# Patient Record
Sex: Female | Born: 1995 | Race: White | Hispanic: No | Marital: Single | Smoking: Never smoker
Health system: Southern US, Community
[De-identification: ages and names within clinical notes are randomized; demographics above are authoritative.]

## PROBLEM LIST (undated history)

## (undated) DIAGNOSIS — E1065 Type 1 diabetes mellitus with hyperglycemia: Secondary | ICD-10-CM

## (undated) DIAGNOSIS — E119 Type 2 diabetes mellitus without complications: Secondary | ICD-10-CM

## (undated) DIAGNOSIS — L819 Disorder of pigmentation, unspecified: Secondary | ICD-10-CM

## (undated) HISTORY — DX: Type 2 diabetes mellitus without complications: E11.9

## (undated) HISTORY — DX: Type 1 diabetes mellitus with hyperglycemia: E10.65

## (undated) HISTORY — PX: WISDOM TOOTH EXTRACTION: SHX21

---
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
## 2017-01-12 ENCOUNTER — Encounter (INDEPENDENT_AMBULATORY_CARE_PROVIDER_SITE_OTHER): Payer: Self-pay | Admitting: Family

## 2017-01-31 ENCOUNTER — Other Ambulatory Visit (INDEPENDENT_AMBULATORY_CARE_PROVIDER_SITE_OTHER): Payer: Self-pay | Admitting: Family

## 2017-02-04 ENCOUNTER — Encounter (INDEPENDENT_AMBULATORY_CARE_PROVIDER_SITE_OTHER): Payer: Self-pay | Admitting: Family

## 2017-02-08 ENCOUNTER — Encounter (INDEPENDENT_AMBULATORY_CARE_PROVIDER_SITE_OTHER): Payer: Self-pay | Admitting: Family

## 2017-02-11 ENCOUNTER — Encounter (INDEPENDENT_AMBULATORY_CARE_PROVIDER_SITE_OTHER): Payer: Self-pay | Admitting: Family

## 2017-03-23 ENCOUNTER — Other Ambulatory Visit (INDEPENDENT_AMBULATORY_CARE_PROVIDER_SITE_OTHER): Payer: Self-pay | Admitting: *Deleted

## 2017-03-23 ENCOUNTER — Ambulatory Visit (INDEPENDENT_AMBULATORY_CARE_PROVIDER_SITE_OTHER): Payer: BLUE CROSS/BLUE SHIELD | Admitting: Family

## 2017-03-23 ENCOUNTER — Encounter (INDEPENDENT_AMBULATORY_CARE_PROVIDER_SITE_OTHER): Payer: Self-pay | Admitting: Family

## 2017-03-23 VITALS — BP 104/62 | HR 84 | Ht 66.81 in | Wt 131.6 lb

## 2017-03-23 DIAGNOSIS — Z23 Encounter for immunization: Secondary | ICD-10-CM | POA: Diagnosis not present

## 2017-03-23 DIAGNOSIS — F4320 Adjustment disorder, unspecified: Secondary | ICD-10-CM | POA: Diagnosis not present

## 2017-03-23 DIAGNOSIS — R739 Hyperglycemia, unspecified: Secondary | ICD-10-CM

## 2017-03-23 DIAGNOSIS — IMO0001 Reserved for inherently not codable concepts without codable children: Secondary | ICD-10-CM

## 2017-03-23 DIAGNOSIS — E1065 Type 1 diabetes mellitus with hyperglycemia: Principal | ICD-10-CM

## 2017-03-23 DIAGNOSIS — Z4681 Encounter for fitting and adjustment of insulin pump: Secondary | ICD-10-CM | POA: Diagnosis not present

## 2017-03-23 DIAGNOSIS — E109 Type 1 diabetes mellitus without complications: Secondary | ICD-10-CM | POA: Diagnosis not present

## 2017-03-23 LAB — POCT GLUCOSE (DEVICE FOR HOME USE): POC GLUCOSE: 221 mg/dL — AB (ref 70–99)

## 2017-03-23 LAB — POCT GLYCOSYLATED HEMOGLOBIN (HGB A1C): HEMOGLOBIN A1C: 7.3

## 2017-03-23 MED ORDER — GLUCOSE BLOOD VI STRP: ORAL_STRIP | 3 refills | Status: DC

## 2017-03-23 NOTE — Patient Instructions (Signed)
-   Pump settings changed - Continue CGm  - Rotate pump sites  - Keep glucose with you.  - Behave at school  - A1c is 7.3%.

## 2017-03-23 NOTE — Progress Notes (Signed)
Pediatric Endocrinology Diabetes Consultation Follow-up Visit  Katelyn Lutz March 06, 1996 [id number]  Chief Complaint: Follow-up type 1 diabetes   Ronnald Nian, MD   HPI: Katelyn Lutz  is a 21 y.o. female presenting for follow-up of type 1 diabetes. she is accompanied to this visit by her mother.  1. Katelyn Lutz was diagnosed with new onset type 1 diabetes on 10/12/12. She had completed a course of antibiotics for a facial laceration and had developed oral thrush. She was seen at Urgent Care where they questioned why a previously healthy young adult would have thrush. They obtained a finger stick BG which was too high to read on the POC BG meter. They then obtained serum labs and urine which were consistent with new onset diabetes. She was then sent to [facility] for admission. On presentation she was acidotic with a pH of 7.2. She was admitted for treatment and management of DKA and new-onset T1DM. She was started on a multiple daily injection (MDI) of Lantus and Novolog insulins. She was subsequently converted to a Medtronic 530G insulin pump and Enlite CGM sensor on 08/07/13. Later, however, she converted from the [facility] sensor to the Dexcom G5 sensor that communicates with her i-phone  2. Since last visit to [facility] on 12/2016, she has been well.  No ER visits or hospitalizations.   Katelyn Lutz is doing "good". She is home on break from [employer] for Thanksgiving. She is making all A's in her classes and is enjoying her college experience. Katelyn Lutz reports that her blood sugars are good overall but she is having lows around 6-8 am. She is wearing a Medtronic 530g insulin pump and uses a Dexcom CGM. She has educated her roommates on what to do if she has a low blood sugar and she feels comfortable with her diabetes at college. Overall, she feels things are going well.     Insulin regimen:   Basal:         12am: 0.90   4am: 1.10   8am: 1.10   12pm: 1.20   9pm: 1.30  Carb Ratio:   12am: 15   6am:  10   2pm: 12  Insulin Sensitivity    12am: 50.    10pm: 65  Hypoglycemia: Able to feel low blood sugars.  No glucagon needed recently.  Insulin Pump download: Avg BG: 173  Checking an avg of 6  times per day  Avg daily carb intake 258 grams.  Avg total daily insulin 50 units (51% basal, 49% bolus) CGM: Avg Bg 172  - Target Range: In range 49%, above rnage 50% and below range 1%.  Med-alert ID: Bracelet on wrist. Injection sites: Using abdomen and leg Annual labs due: 06/2017 Ophthalmology due: 06/2017    3. ROS: Greater than 10 systems reviewed with pertinent positives listed in HPI, otherwise neg. Review of Systems  Constitutional: Negative for malaise/fatigue.  HENT: Negative.   Eyes: Negative for blurred vision, photophobia and pain.       Wears glasses  Respiratory: Negative for cough and shortness of breath.   Cardiovascular: Negative for chest pain and palpitations.  Gastrointestinal: Negative for abdominal pain, constipation, diarrhea and nausea.  Genitourinary: Negative for frequency and urgency.  Musculoskeletal: Negative for neck pain.  Skin: Negative for itching and rash.  Neurological: Negative for dizziness, tingling, tremors, sensory change, seizures, weakness and headaches.  Endo/Heme/Allergies: Negative for polydipsia.  Psychiatric/Behavioral: Negative for depression. The patient is not nervous/anxious.      Past Medical History:   Past  Medical History:  Diagnosis Date  . Diabetes mellitus without complication (HCC)   . New onset type 1 diabetes mellitus, uncontrolled (HCC) 10/25/2012  . Pigmentation abnormality of skin    no pigment in eyelids or genital area    Medications:  Outpatient Encounter Medications as of 03/23/2017  Medication Sig  . ACCU-CHEK FASTCLIX LANCETS MISC Check sugar 10 x daily. Lancets come in boxes of 102 ea.. Please dispense 9 boxes.  Marland Kitchen. BAYER CONTOUR NEXT TEST test strip CHECK BLOOD SUGAR 10 X DAILY AS DIRECTED. PT IS ON INSULIN  PUMP.  Marland Kitchen. glucagon (GLUCAGON EMERGENCY) 1 MG injection USE FOR SEVERE HYPOGLYCEMIA, INJECT 1MG  INTRAMUSCULARLY IF UNRESPONSIVE OR UNCONCIOUS  . NOVOLOG 100 UNIT/ML injection USE 300 UNITS IN INSULIN PUMP EVERY 48-72 HOURS PER PROTOCOL  . Urine Glucose-Ketones Test STRP Use to check urine in cases of hyperglycemia. Dispense 2 vials of 50 each  . [DISCONTINUED] lidocaine-prilocaine (EMLA) cream Apply to skin as directed 30-60 minutes prior to inserting infusion set. Clean skin with alcohol prior to inserting. (Patient not taking: Reported on 09/28/2016)   No facility-administered encounter medications on file as of 03/23/2017.     Allergies: No Known Allergies  Surgical History: No past surgical history on file.  Family History:  Family History  Problem Relation Age of Onset  . Diabetes Other        father's maternal grandmother had type 1 diabetes  . Hypertension Maternal Aunt   . Asthma Maternal Uncle   . Asthma Paternal Aunt   . Hypertension Maternal Grandfather   . Thyroid disease Paternal Grandmother       Social History: Lives with: Mother and father   [employer]man year at [employer] Physical Exam:  Vitals:   03/23/17 1015  BP: 104/62  Pulse: 84  Weight: 131 lb 9.6 oz (59.7 kg)  Height: 5' 6.81" (1.697 m)   BP 104/62   Pulse 84   Ht 5' 6.81" (1.697 m)   Wt 131 lb 9.6 oz (59.7 kg)   BMI 20.73 kg/m  Body mass index: body mass index is 20.73 kg/m. Blood pressure percentiles are 16 % systolic and 26 % diastolic based on the August 2017 AAP Clinical Practice Guideline. Blood pressure percentile targets: 90: 127/78, 95: 129/82, 95 + 12 mmHg: 141/94.  Ht Readings from Last 3 Encounters:  03/23/17 5' 6.81" (1.697 m) (84 %, Z= 1.00)*  09/28/16 5' 6.54" (1.69 m) (82 %, Z= 0.90)*  06/28/16 5' 6.65" (1.693 m) (83 %, Z= 0.95)*   * Growth percentiles are based on CDC (Girls, 2-20 Years) data.   Wt Readings from Last 3 Encounters:  03/23/17 131 lb 9.6 oz (59.7 kg) (60 %, Z= 0.24)*   12/15/16 128 lb (58.1 kg) (55 %, Z= 0.11)*  09/28/16 129 lb 3.2 oz (58.6 kg) (58 %, Z= 0.20)*   * Growth percentiles are based on CDC (Girls, 2-20 Years) data.   Physical Exam   General: Well developed, well nourished female in no acute distress.  Appears  stated age. She is active and oriented.  Head: Normocephalic, atraumatic.   Eyes:  Pupils equal and round. EOMI.   Sclera white.  No eye drainage. Wearing glasses.  Ears/Nose/Mouth/Throat: Nares patent, no nasal drainage.  Normal dentition, mucous membranes moist.  Oropharynx intact. Neck: supple, no cervical lymphadenopathy, no thyromegaly Cardiovascular: regular rate, normal S1/S2, no murmurs Respiratory: No increased work of breathing.  Lungs clear to auscultation bilaterally.  No wheezes. Abdomen: soft, nontender, nondistended. Normal bowel sounds.  No appreciable masses  Extremities: warm, well perfused, cap refill < 2 sec.   Musculoskeletal: Normal muscle mass.  Normal strength Skin: warm, dry.  No rash or lesions. Dexcom site to her right arm. Pump site to abdomen.  Neurologic: alert and oriented, normal speech and gait   Labs: Last hemoglobin A1c:  Lab Results  Component Value Date   HGBA1C 7.3 03/23/2017   Results for orders placed or performed in visit on 03/23/17  POCT Glucose (Device for Home Use)  Result Value Ref Range   Glucose Fasting, POC  70 - 99 mg/dL   POC Glucose 562221 (A) 70 - 99 mg/dl  POCT HgB [id number]  Result Value Ref Range   Hemoglobin A1C 7.3     Assessment/Plan: Katelyn Lutz is a 21 y.o. female with Type 1 diabetes in good control on insulin pump therapy. Katelyn Lutz continues to do well managing her diabetes, she has adjusted well to the changes of being in college. Will reduce basal overnight to prevent morning lows.   1. Type 1 Diabetes w/o complication.  - Change pump site every 3 days - Rotate site placement with each change.  - Wear Dexcom CGM or check bg at least 4 x per day  - Keep glucose with you  at all times.  - Give insulin prior to eating.  - Glucose as above.  - A1c as above.    2. Insulin Pump Titration   - I spent extensive time reviewing glucose download, CGM download and carb intake to make changes to insulin dosages.   - Basal Changes 12am:  0.90--> 0.85 4am:  1.10--> 1.05  8am: 1.10 12pm: 1.20 9pm: 1.3     3. Adjustment Reaction  - Discussed alcohol and drug use during college.  - Encouraged to wear medical alert at all times.  - Answered questions.    Follow-up:   3 months     Katelyn ShortSpenser Pastor Sgro, FNP-C

## 2017-06-06 ENCOUNTER — Encounter (INDEPENDENT_AMBULATORY_CARE_PROVIDER_SITE_OTHER): Payer: Self-pay | Admitting: Family

## 2017-06-07 ENCOUNTER — Encounter (INDEPENDENT_AMBULATORY_CARE_PROVIDER_SITE_OTHER): Payer: Self-pay | Admitting: Family

## 2017-06-08 NOTE — Telephone Encounter (Signed)
Hi Katelyn Lutz. Try these settings for a couple days and let me know how it works.  1. Increased your 12am basal to 0.90  2. Change your insulin sensitivity (correction factor) from 50 to 45 at 12am  3. Change 2 pm carb ratio from 12--> 10   Let me know if that helps!

## 2017-07-11 ENCOUNTER — Ambulatory Visit (INDEPENDENT_AMBULATORY_CARE_PROVIDER_SITE_OTHER): Payer: BLUE CROSS/BLUE SHIELD | Admitting: Family

## 2017-07-11 ENCOUNTER — Encounter (INDEPENDENT_AMBULATORY_CARE_PROVIDER_SITE_OTHER): Payer: Self-pay | Admitting: Family

## 2017-07-11 VITALS — BP 112/70 | HR 70 | Ht 67.21 in | Wt 137.8 lb

## 2017-07-11 DIAGNOSIS — R739 Hyperglycemia, unspecified: Secondary | ICD-10-CM | POA: Diagnosis not present

## 2017-07-11 DIAGNOSIS — E1065 Type 1 diabetes mellitus with hyperglycemia: Secondary | ICD-10-CM

## 2017-07-11 DIAGNOSIS — E11649 Type 2 diabetes mellitus with hypoglycemia without coma: Secondary | ICD-10-CM

## 2017-07-11 DIAGNOSIS — Z4681 Encounter for fitting and adjustment of insulin pump: Secondary | ICD-10-CM

## 2017-07-11 DIAGNOSIS — F4320 Adjustment disorder, unspecified: Secondary | ICD-10-CM | POA: Diagnosis not present

## 2017-07-11 DIAGNOSIS — E109 Type 1 diabetes mellitus without complications: Secondary | ICD-10-CM

## 2017-07-11 LAB — POCT GLYCOSYLATED HEMOGLOBIN (HGB A1C): Hemoglobin A1C: 7.2

## 2017-07-11 LAB — POCT GLUCOSE (DEVICE FOR HOME USE): POC Glucose: 213 mg/dl — AB (ref 70–99)

## 2017-07-11 NOTE — Progress Notes (Signed)
Pediatric Endocrinology Diabetes Consultation Follow-up Visit  Katelyn Lutz 05-26-1995 [id number]  Chief Complaint: Follow-up type 1 diabetes   Ronnald Nian, MD   HPI: Katelyn Lutz  is a 22 y.o. female presenting for follow-up of type 1 diabetes. she is accompanied to this visit by her mother.  1. Katelyn Lutz was diagnosed with new onset type 1 diabetes on 10/12/12. She had completed a course of antibiotics for a facial laceration and had developed oral thrush. She was seen at Urgent Care where they questioned why a previously healthy young adult would have thrush. They obtained a finger stick BG which was too high to read on the POC BG meter. They then obtained serum labs and urine which were consistent with new onset diabetes. She was then sent to [facility] for admission. On presentation she was acidotic with a pH of 7.2. She was admitted for treatment and management of DKA and new-onset T1DM. She was started on a multiple daily injection (MDI) of Lantus and Novolog insulins. She was subsequently converted to a Medtronic 530G insulin pump and Enlite CGM sensor on 08/07/13. Later, however, she converted from the [facility] sensor to the Dexcom G5 sensor that communicates with her i-phone  2. Since last visit to [facility] on 03/2017, she has been well.  No ER visits or hospitalizations.   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 She reports that things are going well with her diabetes except that her blood sugars are staying high after she eats. She is bolusing before she eats about 90% of the time. She feels like her carb counting is pretty good overall. She has been going to the gym almost every day which she feels like helps her diabetes control.   Katelyn Lutz continues to use the Medtronic 530g insulin pump. She recently was notified that her warranty has expired but insurance will not replace it until it stops working. Katelyn Lutz is nervous about this because she feels like her  pump already goes through batteries to fast. She would like information about pumps that are available. She is wearing Dexcom CGM and is very happy with it performance.    Insulin regimen:   Basal:         12am: 0.825   4am: 1.05   8am: 1.10   12pm: 1.20   9pm: 1.30  Carb Ratio:   12am: 15   6am: 10   2pm: 40  Insulin Sensitivity    12am: 45.    10pm: 65  Target BG: 150   Hypoglycemia: Able to feel low blood sugars.  No glucagon needed recently.  Insulin Pump download: Avg Bg 193. Checking 6 x per day   - Using 64 units per day. 60% bolus and 40% basal CGM: Avg Bg 191  - Target Bg: In range 42%, above range 57% and below range 1%.  Med-alert ID: Bracelet on wrist. Injection sites: Using abdomen and leg Annual labs due: 07/2018 (ordered today)  Ophthalmology due: 06/2018    3. ROS: Greater than 10 systems reviewed with pertinent positives listed in HPI, otherwise neg. Review of Systems  Constitutional: Negative for malaise/fatigue.  HENT: Negative.   Eyes: Negative for blurred vision, photophobia and pain.       Wears glasses  Respiratory: Negative for cough and shortness of breath.   Cardiovascular: Negative for chest pain and palpitations.  Gastrointestinal: Negative for abdominal pain, constipation, diarrhea and nausea.  Genitourinary: Negative for frequency and urgency.  Musculoskeletal:  Negative for neck pain.  Skin: Negative for itching and rash.  Neurological: Negative for dizziness, tingling, tremors, sensory change, seizures, weakness and headaches.  Endo/Heme/Allergies: Negative for polydipsia.  Psychiatric/Behavioral: Negative for depression. The patient is not nervous/anxious.      Past Medical History:   Past Medical History:  Diagnosis Date  . Diabetes mellitus without complication (HCC)   . New onset type 1 diabetes mellitus, uncontrolled (HCC) 10/25/2012  . Pigmentation abnormality of skin    no pigment in eyelids or genital area    Medications:   Outpatient Encounter Medications as of 07/11/2017  Medication Sig  . ACCU-CHEK FASTCLIX LANCETS MISC Check sugar 10 x daily. Lancets come in boxes of 102 ea.. Please dispense 9 boxes.  Marland Kitchen. BAYER CONTOUR NEXT TEST test strip CHECK BLOOD SUGAR 10 X DAILY AS DIRECTED. PT IS ON INSULIN PUMP.  Marland Kitchen. glucagon (GLUCAGON EMERGENCY) 1 MG injection USE FOR SEVERE HYPOGLYCEMIA, INJECT 1MG  INTRAMUSCULARLY IF UNRESPONSIVE OR UNCONCIOUS  . NOVOLOG 100 UNIT/ML injection USE 300 UNITS IN INSULIN PUMP EVERY 48-72 HOURS PER PROTOCOL  . Urine Glucose-Ketones Test STRP Use to check urine in cases of hyperglycemia. Dispense 2 vials of 50 each  . [DISCONTINUED] glucose blood (BAYER CONTOUR TEST) test strip Check glucose 6x daily   No facility-administered encounter medications on file as of 07/11/2017.     Allergies: No Known Allergies  Surgical History: No past surgical history on file.  Family History:  Family History  Problem Relation Age of Onset  . Diabetes Other        father's maternal grandmother had type 1 diabetes  . Hypertension Maternal Aunt   . Asthma Maternal Uncle   . Asthma Paternal Aunt   . Hypertension Maternal Grandfather   . Thyroid disease Paternal Grandmother       Social History: Lives with: Mother and father   Katelyn [employer]Freshman year at [employer] Physical Exam:  Vitals:   07/11/17 1005  BP: 112/70  Pulse: 70  Weight: 137 lb 12.8 oz (62.5 kg)  Height: 5' 7.21" (1.707 m)   BP 112/70   Pulse 70   Ht 5' 7.21" (1.707 m)   Wt 137 lb 12.8 oz (62.5 kg)   BMI 21.45 kg/m  Body mass index: body mass index is 21.45 kg/m. Blood pressure percentiles are 43 % systolic and 66 % diastolic based on the August 2017 AAP Clinical Practice Guideline. Blood pressure percentile targets: 90: 127/78, 95: 130/81, 95 + 12 mmHg: 142/93.  Ht Readings from Last 3 Encounters:  07/11/17 5' 7.21" (1.707 m) (87 %, Z= 1.15)*  03/23/17 5' 6.81" (1.697 m) (84 %, Z= 1.00)*  09/28/16 5' 6.54" (1.69 m) (82 %, Z= 0.90)*    * Growth percentiles are based on CDC (Girls, 2-20 Years) data.   Wt Readings from Last 3 Encounters:  07/11/17 137 lb 12.8 oz (62.5 kg) (68 %, Z= 0.46)*  03/23/17 131 lb 9.6 oz (59.7 kg) (60 %, Z= 0.24)*  12/15/16 128 lb (58.1 kg) (55 %, Z= 0.11)*   * Growth percentiles are based on CDC (Girls, 2-20 Years) data.   Physical Exam   General: Well developed, well nourished female in no acute distress. She is alert and oriented.  Head: Normocephalic, atraumatic.   Eyes:  Pupils equal and round. EOMI.   Sclera white.  No eye drainage.   Ears/Nose/Mouth/Throat: Nares patent, no nasal drainage.  Normal dentition, mucous membranes moist.  Oropharynx intact. Neck: supple, no cervical lymphadenopathy, no thyromegaly Cardiovascular: regular rate,  normal S1/S2, no murmurs Respiratory: No increased work of breathing.  Lungs clear to auscultation bilaterally.  No wheezes. Abdomen: soft, nontender, nondistended. Normal bowel sounds.  No appreciable masses  Extremities: warm, well perfused, cap refill < 2 sec.   Musculoskeletal: Normal muscle mass.  Normal strength Skin: warm, dry.  No rash or lesions. Dexcom to arm. Insulin pump to abdomen.  Neurologic: alert and oriented, normal speech    Labs:  Lab Results  Component Value Date   HGBA1C 7.2 07/11/2017   Results for orders placed or performed in visit on 07/11/17  POCT Glucose (Device for Home Use)  Result Value Ref Range   Glucose Fasting, POC  70 - 99 mg/dL   POC Glucose 952 (A) 70 - 99 mg/dl  POCT HgB W4X  Result Value Ref Range   Hemoglobin A1C 7.2     Assessment/Plan: Katelyn Lutz is a 23 y.o. female with Type 1 diabetes in good control on insulin pump therapy. Katelyn Lutz is hyperglycemic after meals despite giving boluses before eating. She needs a stronger carb ratio. She is doing well on insulin pump therapy with Dexcom CGM but will need to pick a new pump soon as her warranty has expired. Her hemoglobin A1c is 7.2% which meets ADA  goal of <7.5%.    1. Type 1 Diabetes w/o complication/Hyperglycmia  - Continue Medtronic 530g pump - Discussed Medtronic 670g, Tandem Tslim and Omnipod.   - Gave information about all pumps.  - Dexcom CGM--> if not wearing check bg at least 4 x per day  - bolus before eating.  - Rotate pump sites with each set change.  - POCT glucose as above.  - POCT hemoglobin A1c  - reviewed growth chart.  - Annual labs: CMP, lipid panel, TFTs, Microalbumin.   2. Insulin Pump Titration   - I spent extensive time reviewing glucose download, CGM download and carb intake to make changes to insulin dosages.   - Carb changes   - 6am: 10--> 9   - 2pm: 10--> 9.5   - Target Bg  - 150--> 130     3. Adjustment Reaction  - Wear medic alert.  - Discussed alcohol and drug use - Answered questions.    Follow-up:   3 months   When a patient is on insulin, intensive monitoring of blood glucose levels is necessary to avoid hyperglycemia and hypoglycemia. Severe hyperglycemia/hypoglycemia can lead to hospital admissions and be life threatening.    Gretchen Short,  FNP-C  Pediatric Specialist  [address]  [place], [address]  Tele: [phone]

## 2017-07-11 NOTE — Patient Instructions (Signed)
-   Carb changes   - 6am: 10--> 9   - 2pm: 10--> 9.5   - Target Bg  - 150--> 130   - bolus before eating.

## 2017-07-12 LAB — COMPREHENSIVE METABOLIC PANEL
AG RATIO: 2 (calc) (ref 1.0–2.5)
ALBUMIN MSPROF: 4.3 g/dL (ref 3.6–5.1)
ALT: 23 U/L (ref 5–32)
AST: 22 U/L (ref 12–32)
Alkaline phosphatase (APISO): 79 U/L (ref 47–176)
BUN: 14 mg/dL (ref 7–20)
CO2: 31 mmol/L (ref 20–32)
Calcium: 9.2 mg/dL (ref 8.9–10.4)
Chloride: 99 mmol/L (ref 98–110)
Creat: 0.78 mg/dL (ref 0.50–1.00)
GLUCOSE: 229 mg/dL — AB (ref 65–99)
Globulin: 2.2 g/dL (calc) (ref 2.0–3.8)
POTASSIUM: 4.5 mmol/L (ref 3.8–5.1)
SODIUM: 136 mmol/L (ref 135–146)
TOTAL PROTEIN: 6.5 g/dL (ref 6.3–8.2)
Total Bilirubin: 0.6 mg/dL (ref 0.2–1.1)

## 2017-07-12 LAB — LIPID PANEL
Cholesterol: 185 mg/dL — ABNORMAL HIGH (ref <170)
HDL: 77 mg/dL (ref >45)
LDL CHOLESTEROL (CALC): 94 mg/dL (ref <110)
Non-HDL Cholesterol (Calc): 108 mg/dL (calc) (ref <120)
Total CHOL/HDL Ratio: 2.4 (calc) (ref <5.0)
Triglycerides: 58 mg/dL (ref <90)

## 2017-07-12 LAB — TSH: TSH: 1.17 m[IU]/L

## 2017-07-12 LAB — T4, FREE: Free T4: 0.9 ng/dL (ref 0.8–1.4)

## 2017-07-12 LAB — MICROALBUMIN / CREATININE URINE RATIO
CREATININE, URINE: 117 mg/dL (ref 20–275)
MICROALB UR: 0.3 mg/dL
Microalb Creat Ratio: 3 mcg/mg creat (ref <30)

## 2017-08-09 ENCOUNTER — Other Ambulatory Visit (INDEPENDENT_AMBULATORY_CARE_PROVIDER_SITE_OTHER): Payer: Self-pay | Admitting: Family

## 2017-08-09 ENCOUNTER — Other Ambulatory Visit (INDEPENDENT_AMBULATORY_CARE_PROVIDER_SITE_OTHER): Payer: Self-pay | Admitting: *Deleted

## 2017-08-09 MED ORDER — INSULIN ASPART 100 UNIT/ML ~~LOC~~ SOLN: SUBCUTANEOUS | 1 refills | Status: DC

## 2017-08-31 ENCOUNTER — Other Ambulatory Visit (INDEPENDENT_AMBULATORY_CARE_PROVIDER_SITE_OTHER): Payer: Self-pay | Admitting: Family

## 2017-09-08 ENCOUNTER — Other Ambulatory Visit (INDEPENDENT_AMBULATORY_CARE_PROVIDER_SITE_OTHER): Payer: Self-pay

## 2017-09-08 MED ORDER — INSULIN ASPART 100 UNIT/ML ~~LOC~~ SOLN: SUBCUTANEOUS | 1 refills | Status: DC

## 2017-10-07 ENCOUNTER — Ambulatory Visit (INDEPENDENT_AMBULATORY_CARE_PROVIDER_SITE_OTHER): Payer: BLUE CROSS/BLUE SHIELD | Admitting: Family

## 2017-10-07 ENCOUNTER — Encounter (INDEPENDENT_AMBULATORY_CARE_PROVIDER_SITE_OTHER): Payer: Self-pay | Admitting: Family

## 2017-10-07 VITALS — BP 132/84 | HR 90 | Ht 67.21 in | Wt 139.4 lb

## 2017-10-07 DIAGNOSIS — R739 Hyperglycemia, unspecified: Secondary | ICD-10-CM

## 2017-10-07 DIAGNOSIS — E11649 Type 2 diabetes mellitus with hypoglycemia without coma: Secondary | ICD-10-CM | POA: Diagnosis not present

## 2017-10-07 DIAGNOSIS — Z9641 Presence of insulin pump (external) (internal): Secondary | ICD-10-CM

## 2017-10-07 DIAGNOSIS — E1065 Type 1 diabetes mellitus with hyperglycemia: Secondary | ICD-10-CM | POA: Diagnosis not present

## 2017-10-07 DIAGNOSIS — IMO0001 Reserved for inherently not codable concepts without codable children: Secondary | ICD-10-CM

## 2017-10-07 LAB — POCT GLYCOSYLATED HEMOGLOBIN (HGB A1C): Hemoglobin A1C: 6.8 % — AB (ref 4.0–5.6)

## 2017-10-07 LAB — POCT GLUCOSE (DEVICE FOR HOME USE): GLUCOSE FASTING, POC: 156 mg/dL — AB (ref 70–99)

## 2017-10-07 NOTE — Patient Instructions (Signed)
a1c is 6.8%  Follow up in 3 months.

## 2017-10-07 NOTE — Progress Notes (Signed)
Pediatric Endocrinology Diabetes Consultation Follow-up Visit  Katelyn Lutz 1996-05-03 [id number]  Chief Complaint: Follow-up type 1 diabetes   Ronnald NianLalonde, [name], MD   HPI: Katelyn Lutz Katelyn Lutz  is a 22 y.o. female presenting for follow-up of type 1 diabetes. she is accompanied to this visit by her mother.  1. Katelyn Lutz was diagnosed with new onset type 1 diabetes on 10/12/12. She had completed a course of antibiotics for a facial laceration and had developed oral thrush. She was seen at Urgent Care where they questioned why a previously healthy young adult would have thrush. They obtained a finger stick BG which was too high to read on the POC BG meter. They then obtained serum labs and urine which were consistent with new onset diabetes. [facility] was then [facility]. On presentation she was acidotic with a pH of 7.2. She was admitted for treatment and management of DKA and new-onset T1DM. She was started on a multiple daily injection (MDI) of Lantus and Novolog insulins. She was subsequently converted to a Medtronic 530G insulin pump and Enlite CGM sensor on 08/07/13. Later, however, [facility] to the Dexcom G5 sensor that communicates with her i-phone  2. Since last visit to [facility] on 07/2017, she has been well.  No ER visits or hospitalizations.   She is on summer break from [employer], she made mainly A's and B's this semester. Reports that her diabetes care is going very well. Wearing Medtronic 530g insulin pump and using Dexcom CGM. Doing well with carb counting. Reports hypoglycemia happens a couple times per week at random times. She rotates her pump site every 3-4 days. Happy with her blood sugars overall.    Insulin regimen:   Basal:         12am: 0.825   4am: 1.05   8am: 1.10   12pm: 1.20   9pm: 1.30  Carb Ratio:   12am: 15   6am: 9   2pm: 9.5  Insulin Sensitivity    12am: 45.    10pm: 65   Target BG: 130   Hypoglycemia: Able to feel low blood  sugars.  No glucagon needed recently.  Insulin Pump download:   - Avg Bg 159. Checking 6x per day   - Using 51 units per day  - 51% basal and 59% bolus   Dexcom CGM Download:   - Avg Bg 183  - Target range; in target 41%, above target 57% and below target 1%  Med-alert ID: Bracelet on wrist. Injection sites: Using abdomen and leg Annual labs due: 07/2018  Ophthalmology due: Has appointment on 10/2017    3. ROS: Greater than 10 systems reviewed with pertinent positives listed in HPI, otherwise neg. Review of Systems  Constitutional: Negative for malaise/fatigue.  HENT: Negative.   Eyes: Negative for blurred vision, photophobia and pain.       Wears glasses  Respiratory: Negative for cough and shortness of breath.   Cardiovascular: Negative for chest pain and palpitations.  Gastrointestinal: Negative for abdominal pain, constipation, diarrhea and nausea.  Genitourinary: Negative for frequency and urgency.  Musculoskeletal: Negative for neck pain.  Skin: Negative for itching and rash.  Neurological: Negative for dizziness, tingling, tremors, sensory change, seizures, weakness and headaches.  Endo/Heme/Allergies: Negative for polydipsia.  Psychiatric/Behavioral: Negative for depression. The patient is not nervous/anxious.      Past Medical History:   Past Medical History:  Diagnosis Date  . Diabetes mellitus without complication (HCC)   . New  onset type 1 diabetes mellitus, uncontrolled (HCC) 10/25/2012  . Pigmentation abnormality of skin    no pigment in eyelids or genital area    Medications:  Outpatient Encounter Medications as of 10/07/2017  Medication Sig  . ACCU-CHEK FASTCLIX LANCETS MISC Check sugar 10 x daily. Lancets come in boxes of 102 ea.. Please dispense 9 boxes.  Marland Kitchen BAYER CONTOUR NEXT TEST test strip CHECK BLOOD SUGAR 10 X DAILY AS DIRECTED. PT IS ON INSULIN PUMP.  Marland Kitchen glucagon (GLUCAGON EMERGENCY) 1 MG injection USE FOR SEVERE HYPOGLYCEMIA, INJECT 1MG   INTRAMUSCULARLY IF UNRESPONSIVE OR UNCONCIOUS  . Urine Glucose-Ketones Test STRP Use to check urine in cases of hyperglycemia. Dispense 2 vials of 50 each  . insulin aspart (NOVOLOG) 100 UNIT/ML injection USE 300 UNITS IN INSULIN PUMP EVERY 48 HOURS.  . [DISCONTINUED] insulin aspart (NOVOLOG) 100 UNIT/ML injection USE 300 UNITS IN INSULIN PUMP EVERY 48 hours.   No facility-administered encounter medications on file as of 10/07/2017.     Allergies: No Known Allergies  Surgical History: No past surgical history on file.  Family History:  Family History  Problem Relation Age of Onset  . Diabetes Other        father's maternal grandmother had type 1 diabetes  . Hypertension Maternal Aunt   . Asthma Maternal Uncle   . Asthma Paternal Aunt   . Hypertension Maternal Grandfather   . 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 Blood pressure percentiles are not available for patients who are 18 years or older.  Ht Readings from Last 3 Encounters:  10/07/17 5' 7.21" (1.707 m) (87 %, Z= 1.15)*  07/11/17 5' 7.21" (1.707 m) (87 %, Z= 1.15)*  03/23/17 5' 6.81" (1.697 m) (84 %, Z= 1.00)*   * Growth percentiles are based on CDC (Girls, 2-20 Years) data.   Wt Readings from Last 3 Encounters:  10/07/17 139 lb 6.4 oz (63.2 kg) (69 %, Z= 0.50)*  07/11/17 137 lb 12.8 oz (62.5 kg) (68 %, Z= 0.46)*  03/23/17 131 lb 9.6 oz (59.7 kg) (60 %, Z= 0.24)*   * Growth percentiles are based on CDC (Girls, 2-20 Years) data.   Physical Exam   General: Well developed, well nourished female in no acute distress.  Alert and oriented.  Head: Normocephalic,  atraumatic.   Eyes:  Pupils equal and round. EOMI.   Sclera white.  No eye drainage.  + glasses  Ears/Nose/Mouth/Throat: Nares patent, no nasal drainage.  Normal dentition, mucous membranes moist.   Neck: supple, no cervical lymphadenopathy, no thyromegaly Cardiovascular: regular rate, normal S1/S2, no murmurs Respiratory: No increased work of breathing.  Lungs clear to auscultation bilaterally.  No wheezes. Abdomen: soft, nontender, nondistended. Normal bowel sounds.  No appreciable masses  Extremities: warm, well perfused, cap refill < 2 sec.   Musculoskeletal: Normal muscle mass.  Normal strength Skin: warm, dry.  No rash or lesions. Dexcom to arm. + insulin pump site.  Neurologic: alert and oriented, normal speech, no tremor     Labs:  Lab Results  Component Value Date   HGBA1C 6.8 (A) 10/07/2017   Results for orders placed or performed in visit on 10/07/17  POCT Glucose (Device for Home Use)  Result Value Ref Range   Glucose Fasting, POC 156 (A) 70 - 99 mg/dL   POC Glucose  70 - 99 mg/dl  POCT HgB Z6X  Result Value Ref Range   Hemoglobin A1C 6.8 (A) 4.0 - 5.6 %   HbA1c, POC (prediabetic range)  5.7 - 6.4 %   HbA1c, POC (controlled diabetic range)  0.0 - 7.0 %    Assessment/Plan: Katelyn Lutz is a 22 y.o. female with Type 1 diabetes in good control on insulin pump therapy. Katelyn Lutz has done well managing her diabetes while away at college. The combination of insulin pump therapy and Dexcom CGM are working well for her. Her hemoglobin A1c is 6.8% which meets ADA goal of <7.5%. .  1. Type 1 Diabetes w/o complication/Hyperglycmia/hypoglycemia   - Medtronic 530 g insulin pump  - Advised to rotate pump site every 3 days  - bolus 15 minutes before eating to limit blood sugar spikes.  - Dexcom CGm for monitoring glucose.  - POCT hemoglobin A1c  - POCT glucose.  - Advised to get annual eye exam.   2. Insulin Pump Titration/Insulin pump in place    - I spent extensive time  reviewing glucose download, CGM download and carb intake to make changes to insulin dosages.   - No changes today. Continue current pump settings as listed in HPI.    Follow-up:   3 months   I have spent >25 minutes with >50% of time in counseling, education and instruction. When a patient is on insulin, intensive monitoring of blood glucose levels is necessary to avoid hyperglycemia and hypoglycemia. Severe hyperglycemia/hypoglycemia can lead to hospital admissions and be life threatening.    Gretchen Short,  FNP-C  Pediatric Specialist  [address]  [place], [address]  Tele: [phone]

## 2017-11-21 ENCOUNTER — Encounter: Payer: Self-pay | Admitting: Nurse Practitioner

## 2017-11-21 LAB — HM DIABETES EYE EXAM

## 2018-02-10 ENCOUNTER — Encounter (INDEPENDENT_AMBULATORY_CARE_PROVIDER_SITE_OTHER): Payer: Self-pay | Admitting: Family

## 2018-02-10 ENCOUNTER — Ambulatory Visit (INDEPENDENT_AMBULATORY_CARE_PROVIDER_SITE_OTHER): Payer: BLUE CROSS/BLUE SHIELD | Admitting: Family

## 2018-02-10 VITALS — BP 104/70 | HR 80 | Ht 67.28 in | Wt 136.6 lb

## 2018-02-10 DIAGNOSIS — R739 Hyperglycemia, unspecified: Secondary | ICD-10-CM | POA: Diagnosis not present

## 2018-02-10 DIAGNOSIS — Z4681 Encounter for fitting and adjustment of insulin pump: Secondary | ICD-10-CM | POA: Insufficient documentation

## 2018-02-10 DIAGNOSIS — E1065 Type 1 diabetes mellitus with hyperglycemia: Secondary | ICD-10-CM | POA: Diagnosis not present

## 2018-02-10 DIAGNOSIS — IMO0001 Reserved for inherently not codable concepts without codable children: Secondary | ICD-10-CM

## 2018-02-10 DIAGNOSIS — Z23 Encounter for immunization: Secondary | ICD-10-CM

## 2018-02-10 LAB — POCT GLYCOSYLATED HEMOGLOBIN (HGB A1C): HEMOGLOBIN A1C: 7 % — AB (ref 4.0–5.6)

## 2018-02-10 LAB — POCT GLUCOSE (DEVICE FOR HOME USE): POC Glucose: 235 mg/dl — AB (ref 70–99)

## 2018-02-10 NOTE — Progress Notes (Signed)
Pediatric Endocrinology Diabetes Consultation Follow-up Visit  Katelyn Lutz [birth year] [id number]  Chief Complaint: Follow-up type 1 diabetes   Katelyn Lung, MD   HPI: Katelyn Lutz  is a 22 y.o. female presenting for follow-up of type 1 diabetes. she is accompanied to this visit by her mother.  [name] was diagnosed with new onset type 1 diabetes on 10/12/12. She had completed a course of antibiotics for a facial laceration and had developed oral thrush. She was seen at Urgent Care where they questioned why a previously healthy young adult would have thrush. They obtained a finger stick BG which was too high to read on the POC BG meter. They then obtained serum labs and urine which were consistent with new onset diabetes. She was then sent to [facility] for admission. On presentation she was acidotic with a pH of 7.2. She was admitted for treatment and management of DKA and new-onset T1DM. She was started on a multiple daily injection (MDI) of Lantus and Novolog insulins. She was subsequently converted to a Medtronic 530G insulin pump and Enlite CGM sensor on 08/07/13. Later, however, she converted from the [facility] sensor to the Dexcom G5 sensor that communicates with her i-phone  2. Since last visit to [facility] on 10/2017, she has been well.  No ER visits or hospitalizations.   She is home for fall break from [name]. She reports that things are going well in school but she is very busy taking 19 hours of classes this semester. She is also part of a diabetes support group with 10 other T1DM people. Using Medtronic 530g insulin pump, she is due for an upgrade and wants a new pump when her parents allow it. Denies any issues with insulin pump. Using Dexcom CGM, it is working well for her. Her blood sugars have been well controlled overall but she has noticed that she is having more hyperglycemia at night recently. Has no other concerns today.     Insulin regimen:   Basal:         12am: 0.825   4am:  1.05   8am: 1.10   12pm: 1.20   9pm: 1.30  Carb Ratio:   12am: 15   6am: 11   2pm: 11  Insulin Sensitivity    12am: 45.    10pm: 65   Target BG: 130   Hypoglycemia: Able to feel low blood sugars.  No glucagon needed recently.  Insulin Pump download:   - Using 47 units per day.   - 55% basal and 45% bolus   - Entering 212 grams of carbs per day.   Dexcom CGM Download:   - Avg Bg 205.   - Target Range: in target 36%, above target 64% and below target 1%  - Pattern of hyperglycemia between 10pm and 6am.  Med-alert ID: Bracelet on wrist. Injection sites: Using abdomen and leg Annual labs due: 07/2018  Ophthalmology due: Has appointment on 10/2017    3. ROS: Greater than 10 systems reviewed with pertinent positives listed in HPI, otherwise neg. Review of Systems  Constitutional: Negative for malaise/fatigue.  HENT: Negative.   Eyes: Negative for blurred vision, photophobia and pain.       Wears glasses  Respiratory: Negative for cough and shortness of breath.   Cardiovascular: Negative for chest pain and palpitations.  Gastrointestinal: Negative for abdominal pain, constipation, diarrhea and nausea.  Genitourinary: Negative for frequency and urgency.  Musculoskeletal: Negative for neck pain.  Skin: Negative for itching and rash.  Neurological: Negative for dizziness, tingling, tremors, sensory change, seizures, weakness and headaches.  Endo/Heme/Allergies: Negative for polydipsia.  Psychiatric/Behavioral: Negative for depression. The patient is not nervous/anxious.      Past Medical History:   Past Medical History:  Diagnosis Date  . Diabetes mellitus without complication ([place])   . New onset type 1 diabetes mellitus, uncontrolled ([place]) 10/25/2012  . Pigmentation abnormality of skin    no pigment in eyelids or genital area    Medications:  Outpatient Encounter Medications as of 02/10/2018  Medication Sig  . ACCU-CHEK FASTCLIX LANCETS MISC Check sugar 10 x daily.  Lancets come in boxes of 102 ea.. Please dispense 9 boxes.  Marland Kitchen BAYER CONTOUR NEXT TEST test strip CHECK BLOOD SUGAR 10 X DAILY AS DIRECTED. PT IS ON INSULIN PUMP.  . Continuous Blood Gluc Sensor (DEXCOM G6 SENSOR) MISC Inject 1 kit into the skin daily as needed.  . Continuous Blood Gluc Transmit (DEXCOM G6 TRANSMITTER) MISC 1 kit.  Marland Kitchen glucagon (GLUCAGON EMERGENCY) 1 MG injection USE FOR SEVERE HYPOGLYCEMIA, INJECT 1MG INTRAMUSCULARLY IF UNRESPONSIVE OR UNCONCIOUS  . insulin aspart (NOVOLOG) 100 UNIT/ML injection USE 300 UNITS IN INSULIN PUMP EVERY 48 HOURS.  Marland Kitchen Urine Glucose-Ketones Test STRP Use to check urine in cases of hyperglycemia. Dispense 2 vials of 50 each   No facility-administered encounter medications on file as of 02/10/2018.     Allergies: No Known Allergies  Surgical History: No past surgical history on file.  Family History:  Family History  Problem Relation Age of Onset  . Diabetes Other        father's maternal grandmother had type 1 diabetes  . Hypertension Maternal Aunt   . Asthma Maternal Uncle   . Asthma Paternal Aunt   . Hypertension Maternal Grandfather   . Thyroid disease Paternal Grandmother       Social History: Lives with: Mother and father   Sophomore  year at [name] Physical Exam:  Vitals:   02/10/18 1110  BP: 104/70  Pulse: 80  Weight: 136 lb 9.6 oz (62 kg)  Height: 5' 7.28" (1.709 m)   BP 104/70   Pulse 80   Ht 5' 7.28" (1.709 m)   Wt 136 lb 9.6 oz (62 kg)   BMI 21.21 kg/m  Body mass index: body mass index is 21.21 kg/m. Blood pressure percentiles are not available for patients who are 18 years or older.  Ht Readings from Last 3 Encounters:  02/10/18 5' 7.28" (1.709 m) (88 %, Z= 1.17)*  10/07/17 5' 7.21" (1.707 m) (87 %, Z= 1.15)*  07/11/17 5' 7.21" (1.707 m) (87 %, Z= 1.15)*   * Growth percentiles are based on CDC (Girls, 2-20 Years) data.   Wt Readings from Last 3 Encounters:  02/10/18 136 lb 9.6 oz (62 kg) (64 %, Z= 0.36)*   10/07/17 139 lb 6.4 oz (63.2 kg) (69 %, Z= 0.50)*  07/11/17 137 lb 12.8 oz (62.5 kg) (68 %, Z= 0.46)*   * Growth percentiles are based on CDC (Girls, 2-20 Years) data.   Physical Exam   General: Well developed, well nourished female in no acute distress.  Alert, oriented and pleasant during visit.  Head: Normocephalic, atraumatic.   Eyes:  Pupils equal and round. EOMI.   Sclera white.  No eye drainage.  + glasses  Ears/Nose/Mouth/Throat: Nares patent, no nasal drainage.  Normal dentition, mucous membranes moist.   Neck: supple, no cervical lymphadenopathy, no thyromegaly Cardiovascular: regular rate, normal S1/S2, no murmurs Respiratory: No increased work  of breathing.  Lungs clear to auscultation bilaterally.  No wheezes. Abdomen: soft, nontender, nondistended. Normal bowel sounds.  No appreciable masses  Extremities: warm, well perfused, cap refill < 2 sec.   Musculoskeletal: Normal muscle mass.  Normal strength Skin: warm, dry.  No rash or lesions. + pump site to leg.  Neurologic: alert and oriented, normal speech, no tremor      Labs:  Lab Results  Component Value Date   HGBA1C 7.0 (A) 02/10/2018   Results for orders placed or performed in visit on 02/10/18  POCT Glucose (Device for Home Use)  Result Value Ref Range   Glucose Fasting, POC     POC Glucose 235 (A) 70 - 99 mg/dl  POCT glycosylated hemoglobin (Hb A1C)  Result Value Ref Range   Hemoglobin A1C 7.0 (A) 4.0 - 5.6 %   HbA1c POC (<> result, manual entry)     HbA1c, POC (prediabetic range)     HbA1c, POC (controlled diabetic range)      Assessment/Plan: Lacresia is a 22 y.o. female with Type 1 diabetes in good control on insulin pump therapy. She is doing very well with her diabetes care. She has a pattern of hyperglycemia between 10pm-6am and needs a stronger basal rate. Her hemoglobin A1c is 7% which meets the ADA goal of <7.5%.   1. Type 1 Diabetes w/o complication/Hyperglycmia/hypoglycemia   - Medtronic  530g insulin pump  - Dexcom CGM  - Reviewed pump and dexcom download with patient and discussed patterns.  - Bolus 15 minutes before eating to limit blood sugar spikes.  - Rotate pump sites to prevent scar tissue.  - POCT glucose  - POCT hemoglobin A1c  - reviewed growth chart.    2. Insulin Pump Titration/Insulin pump in place    - I spent extensive time reviewing glucose download, CGM download and carb intake to make changes to insulin dosages.   Basal:         12am: 0.825--> 0.90   4am: 1.05   8am: 1.10   12pm: 1.20   9pm: 1.30--> 1.35   3. Vaccination - Influenza vaccine given. Counseling provided.   Follow-up:   3 months   I have spent >25 minutes with >50% of time in counseling, education and instruction. When a patient is on insulin, intensive monitoring of blood glucose levels is necessary to avoid hyperglycemia and hypoglycemia. Severe hyperglycemia/hypoglycemia can lead to hospital admissions and be life threatening.    Hermenia Bers,  FNP-C  Pediatric Specialist  [address] [place]  [place], [address]  Tele: [phone]

## 2018-02-10 NOTE — Patient Instructions (Signed)
Basal Changes  12am: 0.825--> 0.90  9pm: 1.30--> 1.35   A1c is 7%

## 2018-04-12 ENCOUNTER — Other Ambulatory Visit (INDEPENDENT_AMBULATORY_CARE_PROVIDER_SITE_OTHER): Payer: Self-pay | Admitting: Family

## 2018-05-01 ENCOUNTER — Ambulatory Visit: Payer: BLUE CROSS/BLUE SHIELD | Admitting: Family Medicine

## 2018-05-01 ENCOUNTER — Encounter: Payer: Self-pay | Admitting: Family Medicine

## 2018-05-01 VITALS — BP 106/70 | HR 87 | Temp 98.6°F | Wt 133.6 lb

## 2018-05-01 DIAGNOSIS — B9789 Other viral agents as the cause of diseases classified elsewhere: Secondary | ICD-10-CM | POA: Diagnosis not present

## 2018-05-01 DIAGNOSIS — J069 Acute upper respiratory infection, unspecified: Secondary | ICD-10-CM | POA: Diagnosis not present

## 2018-05-01 NOTE — Progress Notes (Signed)
   Subjective:    Patient ID: Katelyn Lutz, female    DOB: Sep 09, 1995, 22 y.o.   MRN: [id number]  HPI She complains of a four-day history of started with nasal congestion, myalgias, malaise with coughing and then developed a fever.  No sore throat, earache, shortness of breath.  She states that she is feeling slightly better today.  Her blood sugars are still maintaining fairly good levels.   Review of Systems     Objective:   Physical Exam Alert and in no distress. Tympanic membranes and canals are normal. Pharyngeal area is normal. Neck is supple without adenopathy or thyromegaly. Cardiac exam shows a regular sinus rhythm without murmurs or gallops. Lungs are clear to auscultation.        Assessment & Plan:  Viral URI with cough Recommend treating a fever aches and pains with ibuprofen up to 800 mg 3 times daily.  As long as her symptoms improve, no further intervention necessary but if she gets worse, she is to call

## 2018-05-04 ENCOUNTER — Ambulatory Visit (INDEPENDENT_AMBULATORY_CARE_PROVIDER_SITE_OTHER): Payer: BLUE CROSS/BLUE SHIELD | Admitting: *Deleted

## 2018-05-04 ENCOUNTER — Ambulatory Visit (INDEPENDENT_AMBULATORY_CARE_PROVIDER_SITE_OTHER): Payer: BLUE CROSS/BLUE SHIELD | Admitting: Family

## 2018-05-04 ENCOUNTER — Encounter (INDEPENDENT_AMBULATORY_CARE_PROVIDER_SITE_OTHER): Payer: Self-pay | Admitting: Family

## 2018-05-04 VITALS — BP 122/78 | HR 84 | Ht 67.52 in | Wt 135.8 lb

## 2018-05-04 DIAGNOSIS — E11649 Type 2 diabetes mellitus with hypoglycemia without coma: Secondary | ICD-10-CM | POA: Diagnosis not present

## 2018-05-04 DIAGNOSIS — E109 Type 1 diabetes mellitus without complications: Secondary | ICD-10-CM | POA: Diagnosis not present

## 2018-05-04 DIAGNOSIS — Z4681 Encounter for fitting and adjustment of insulin pump: Secondary | ICD-10-CM

## 2018-05-04 DIAGNOSIS — R739 Hyperglycemia, unspecified: Secondary | ICD-10-CM

## 2018-05-04 DIAGNOSIS — IMO0001 Reserved for inherently not codable concepts without codable children: Secondary | ICD-10-CM

## 2018-05-04 DIAGNOSIS — E1065 Type 1 diabetes mellitus with hyperglycemia: Principal | ICD-10-CM

## 2018-05-04 LAB — POCT GLYCOSYLATED HEMOGLOBIN (HGB A1C): Hemoglobin A1C: 6.6 % — AB (ref 4.0–5.6)

## 2018-05-04 LAB — POCT GLUCOSE (DEVICE FOR HOME USE): POC Glucose: 272 mg/dl — AB (ref 70–99)

## 2018-05-04 NOTE — Progress Notes (Signed)
Transfer pump settings to new T-slim insulin pump  Shell was here to transfer her insulin pump settings to the new T-slim insulin pump. She was using the Medtronic's 530G and the Dexcom G6 CGM. She has no questions or concerns at this this time.   Pump overview: Touch screen and general navigation -Screen On (wake) Quick bolus button -Screen lock- turns off pump screen after each interaction -Touch screen-turns off after 3 accidental screen taps -Home screen and home "T" button -Status, bolus and Options button -My pump screen -Keypad screens numbers and letters -Importance of Active confirmation screens -Icons and symbols on touchscreen   Personal Profiles  -Creating a new Personal Profile (Basal rates, insulin sensitivity, IC ratio and BG target) - Edit and review, Active, Duplicate, Delete and renaming a Personal Profile  Alert Settings: -Reminders: Low BG, High BG, after Bolus BG, missed bolus -Alerts: Low insulin, auto off  Pump Settings -Quick Bolus: grams or units increments -Pump Volume: Low, Med, High, or vibrate -Screen Options: Screen Timeout, feature lock  -time and date (importance for accuracy of settings and data)  Pump Info  [id number]  Insulin pump settings:  Time Basal Rate Correction factor Carb ratio Target BG   12a 1.00 45 mg/dL 15 150 mg/dL  4a 1.05 45 mg/dL 15 150 mg/dL  6a 1.05 45 mg/dL 10 130 mg/dL  8a 1.10 45 mg/dL 10 130 mg/dL  12p 1.20 45 mg/dL 11 130 mg/dL  9p 1.35 45 mg/dL 11 130 mg/dL  10p 1.35 65 mg/dL 11 150 mg/dL   Total Basal 27.45 Units      Duration of insulin   3 hours     Maximum Bolus 20 Units     Carb (for calculation) On      Low Insulin Alert On- 20 Units      Auto Off Off     Quick Bolus Off     Basal IQ On        Loading cartridge -Change cartridge: avoid changing at bedtime, use room temperature insulin, fill syringe, and remove bubbles prior to filling cartridge. -Fill Cartridge (min 95 units and max 300 units)  remove air, check for leaks, and connect to infusion set -fill Tubing (Ensure disconnect from site. Check for leaks) - Fill Cannula -Site reminder -fill estimate volume - Do not add or remove insulin after the Load sequence -removal and disposal of used cartridge and infusion set.   Temporary Basal rates - Pump can be program from 0-250%, 15 mins -72 hours, start and stop a Temp rate  My CGM (if applicable) -Entering transmitter ID, entering sensor code, starting sensor session - 2 hour warm up period - Sensor alerts: high/Lows, rise/fall, end session, set volume - Out of range alerts: must be turned on in order to optimize the safety and performance of Basal IQ feature - CGM graph (change display timeline) and trend arrows  - Optimize connectivity between pump and sensor (pump screen facing out)  Pump/CGM history - Delivery summary, total daily dose, bolus, load BG, alerts and alarms - Session and calibration, alerts and error, complete  Delivering Boluses:  - Standard food bolus, adding multiple carbs, cancelling bolus - 0.05 minimum unit bolus 25 units maximum bolus - Entering a BG value, correction bolus, food bolus with correction - Above/Below Bg target and IOB- Bolus calculator Algorithm - Extended Bolus - On  - Quick Bolus Off  Safety: - Importance of backup plan and supplies to carry at all times: insulin syringe  or pen and BG meter (Insulin pump Emergency kit) in case of emergency - Stop and resume insulin delivery - Aseptic/clean technique - Check Bg x daily if not using CGM - Hazard associated with small part and exposure to electromagnetic radiation or MRI - Reminders Low Bg, and High BG retest) site changes or follow DKA protocols - Tandem insulin pump SN warranty info, 24 hour/7 days Technical support [phone]  Basal IQ Technology: - Uses CMG values to predict sensor glucose 30 minutes into future suspends ( stops) insulin delivery if predicted valued <  80 mg/dL - Suspends (stops) insulin delivery if actual sensor glucose value is <80 mg/dL - Basal rate will automatically resume when CGM values begin to rise - Maximum Time of insulin suspension is 2 hours out of any 2.5 hr period - Basal insulin is either delivering or suspended not adjusted - Basal IQ feature does not replace active diabetes management; treatment of hypoglycemia may need to be adjusted. Discuss changes with provider.  Patient verbalized readiness to start insulin pump.  Followed instructions in insulin pump how to change a cartridge. Filled Cartridge with 220 units, after removing air from cartridge.  Filled tubing and attached cartridge to insulin pump.  Parent inserted cannula on Right Upper quadrant, patient tolerated very well. Checked Blood sugar Hi, which required a correction, mother confirmed correction.   Assessment/ Plan: Patient participated with hands on training and asked appropriate questions.  Patient tolerated very well the cannula insertion with no problems. Please call us if you have any questions or concerns regarding your diabetes.        If any technical questions regarding your insulin pump and or CGM, please call the company Tandem and or Dexcom.        T:Connect [email or web address]

## 2018-05-04 NOTE — Progress Notes (Signed)
Pediatric Endocrinology Diabetes Consultation Follow-up Visit  Katelyn Lutz [birth year] [id number]  Chief Complaint: Follow-up type 1 diabetes   [name], MD   HPI: Katelyn Lutz  is a 23 y.o. female presenting for follow-up of type 1 diabetes. she is accompanied to this visit by her mother.  Katelyn Lutz was diagnosed with new onset type 1 diabetes on 10/12/12. She had completed a course of antibiotics for a facial laceration and had developed oral thrush. She was seen at Urgent Care where they questioned why a previously healthy young adult would have thrush. They obtained a finger stick BG which was too high to read on the POC BG meter. They then obtained serum labs and urine which were consistent with new onset diabetes. She was then sent to [facility] for admission. On presentation she was acidotic with a pH of 7.2. She was admitted for treatment and management of DKA and new-onset T1DM. She was started on a multiple daily injection (MDI) of Lantus and Novolog insulins. She was subsequently converted to a Medtronic 530G insulin pump and Enlite CGM sensor on 08/07/13. Later, however, she converted from the [facility] sensor to the Dexcom G5 sensor that communicates with her i-phone  2. Since last visit to [facility] on 01/2018, she has been well.  No ER visits or hospitalizations.   She had a good Christmas break, doing well in school. She is going to get trained to start Tandem insulin pump today. She is excited about getting to use the closed loop Control IQ when it gets released in January. Her Dexcom CGm has been working well, denies any issues. She recently began having frequent hypoglycemia at night, has been decreasing basal by using temp basal of -20%. She is rotating pump sites every 3 days. Bolusing before she eats. Overall, things are going well.      Insulin regimen:   Basal:         12am: 1.35   4am: 1.05   8am: 1.10   12pm: 1.20   9pm: 1.30  Carb Ratio:   12am: 15   6am:  11   2pm: 11  Insulin Sensitivity    12am: 45.    10pm: 65   Target BG: 130   Hypoglycemia: Able to feel low blood sugars.  No glucagon needed recently.  Insulin Pump download:   - Using 47 units per day.   - 55% basal and 45% bolus   - Entering 212 grams of carbs per day.   Dexcom CGM Download:   - Avg bg 173  - Target Range: in target 50%, above target 48% and below target 2%  - Pattern of hypoglycemia between 12am-4am  Med-alert ID: Bracelet on wrist. Injection sites: Using abdomen and leg Annual labs due: 07/2018  Ophthalmology due: Has appointment on 10/2017    3. ROS: Greater than 10 systems reviewed with pertinent positives listed in HPI, otherwise neg. Review of Systems  Constitutional: Negative for malaise/fatigue.  HENT: Negative.   Eyes: Negative for blurred vision, photophobia and pain.       Wears glasses  Respiratory: Negative for cough and shortness of breath.   Cardiovascular: Negative for chest pain and palpitations.  Gastrointestinal: Negative for abdominal pain, constipation, diarrhea and nausea.  Genitourinary: Negative for frequency and urgency.  Musculoskeletal: Negative for neck pain.  Skin: Negative for itching and rash.  Neurological: Negative for dizziness, tingling, tremors, sensory change, seizures, weakness and headaches.  Endo/Heme/Allergies: Negative for polydipsia.  Psychiatric/Behavioral: Negative for depression.  The patient is not nervous/anxious.      Past Medical History:   Past Medical History:  Diagnosis Date  . Diabetes mellitus without complication ([place])   . New onset type 1 diabetes mellitus, uncontrolled (Katelyn Lutz) 10/25/2012  . Pigmentation abnormality of skin    no pigment in eyelids or genital area    Medications:  Outpatient Encounter Medications as of 05/04/2018  Medication Sig  . ACCU-CHEK FASTCLIX LANCETS MISC Check sugar 10 x daily. Lancets come in boxes of 102 ea.. Please dispense 9 boxes.  Marland Kitchen BAYER CONTOUR NEXT TEST  test strip CHECK BLOOD SUGAR 10 X DAILY AS DIRECTED. PT IS ON INSULIN PUMP.  . Continuous Blood Gluc Sensor (DEXCOM G6 SENSOR) MISC Inject 1 kit into the skin daily as needed.  . Continuous Blood Gluc Transmit (DEXCOM G6 TRANSMITTER) MISC 1 kit.  Marland Kitchen insulin aspart (NOVOLOG) 100 UNIT/ML injection USE 300 UNITS IN INSULIN PUMP EVERY 48 HOURS  . Urine Glucose-Ketones Test STRP Use to check urine in cases of hyperglycemia. Dispense 2 vials of 50 each  . glucagon (GLUCAGON EMERGENCY) 1 MG injection USE FOR SEVERE HYPOGLYCEMIA, INJECT 1MG INTRAMUSCULARLY IF UNRESPONSIVE OR UNCONCIOUS   No facility-administered encounter medications on file as of 05/04/2018.     Allergies: No Known Allergies  Surgical History: No past surgical history on file.  Family History:  Family History  Problem Relation Age of Onset  . Diabetes Other        father's maternal grandmother had type 1 diabetes  . Hypertension Maternal Aunt   . Asthma Maternal Uncle   . Asthma Paternal Aunt   . Hypertension Maternal Grandfather   . Thyroid disease Paternal Grandmother       Social History: Lives with: Mother and father   Sophomore  year at [employer] Physical Exam:  Vitals:   05/04/18 0853  BP: 122/78  Pulse: 84  Weight: 135 lb 12.8 oz (61.6 kg)  Height: 5' 7.52" (1.715 m)   BP 122/78   Pulse 84   Ht 5' 7.52" (1.715 m)   Wt 135 lb 12.8 oz (61.6 kg)   BMI 20.94 kg/m  Body mass index: body mass index is 20.94 kg/m. Growth percentile SmartLinks can only be used for patients less than 73 years old.  Ht Readings from Last 3 Encounters:  05/04/18 5' 7.52" (1.715 m)  05/04/18 5' 7.52" (1.715 m)  02/10/18 5' 7.28" (1.709 m) (88 %, Z= 1.17)*   * Growth percentiles are based on CDC (Girls, 2-20 Years) data.   Wt Readings from Last 3 Encounters:  05/04/18 135 lb 12.9 oz (61.6 kg)  05/04/18 135 lb 12.8 oz (61.6 kg)  05/01/18 133 lb 9.6 oz (60.6 kg)   Physical Exam   General: Well developed, well nourished  female in no acute distress.  Alert, oriented and engaged during visit.  Head: Normocephalic, atraumatic.   Eyes:  Pupils equal and round. EOMI.   Sclera white.  No eye drainage.   Ears/Nose/Mouth/Throat: Nares patent, no nasal drainage.  Normal dentition, mucous membranes moist.   Neck: supple, no cervical lymphadenopathy, no thyromegaly Cardiovascular: regular rate, normal S1/S2, no murmurs Respiratory: No increased work of breathing.  Lungs clear to auscultation bilaterally.  No wheezes. Abdomen: soft, nontender, nondistended. Normal bowel sounds.  No appreciable masses  Extremities: warm, well perfused, cap refill < 2 sec.   Musculoskeletal: Normal muscle mass.  Normal strength Skin: warm, dry.  No rash or lesions. + dexcom CGM.  Neurologic: alert and oriented,  normal speech, no tremor       Labs:  Lab Results  Component Value Date   HGBA1C 6.6 (A) 05/04/2018   Results for orders placed or performed in visit on 05/04/18  POCT Glucose (Device for Home Use)  Result Value Ref Range   Glucose Fasting, POC     POC Glucose 272 (A) 70 - 99 mg/dl  POCT HgB A1C  Result Value Ref Range   Hemoglobin A1C 6.6 (A) 4.0 - 5.6 %   HbA1c POC (<> result, manual entry)     HbA1c, POC (prediabetic range)     HbA1c, POC (controlled diabetic range)      Assessment/Plan: Venise is a 23 y.o. female with Type 1 diabetes in good control on insulin pump and CGM therapy. She is switching to Tandem tslim insulin pump today. Blood sugars have been very stable overall. She is having a pattern of hypoglycemia overnight and needs basal reduced. Her hemoglobin A1c is 6.6% which meets the ADA goal of <7%.   1. Type 1 Diabetes w/o complication/Hyperglycmia/hypoglycemia   - Start Tandem Tslim insulin pump  - Dexcom CGm  - Reviewed Dexcom and pump download. Discussed patterns and trends with family.  - Encouraged to use temp basals.   - increase when sick, stressed   - Decrease during activity  - Rotate  pump sites to prevent scar tissue.  - POCT glucose and hemoglobin A1c  - Reviewed growth chart.   2. Insulin Pump Titration/Insulin pump in place    - I spent extensive time reviewing glucose download, CGM download and carb intake to make changes to insulin dosages.    Basal:         12am: 1.35--> 1.0    4am: 1.05   8am: 1.10   12pm: 1.20   9pm: 1.30  Carb Ratio:   12am: 15   6am: 11--> 10   2pm: 11  Follow-up:   3 months   I have spent >40 minutes with >50% of time in counseling, education and instruction. When a patient is on insulin, intensive monitoring of blood glucose levels is necessary to avoid hyperglycemia and hypoglycemia. Severe hyperglycemia/hypoglycemia can lead to hospital admissions and be life threatening.     Hermenia Bers,  FNP-C  Pediatric Specialist  [address] [place]  [place], [address]  Tele: [phone]

## 2018-05-04 NOTE — Patient Instructions (Signed)
-  Always have fast sugar with you in case of low blood sugar (glucose tabs, regular juice or soda, candy) -Always wear your ID that states you have diabetes -Always bring your meter to your visit -Call/Email if you want to review blood sugars  a1c is 6.6%  Follow up in 3 months.

## 2018-05-05 ENCOUNTER — Ambulatory Visit (INDEPENDENT_AMBULATORY_CARE_PROVIDER_SITE_OTHER): Payer: BLUE CROSS/BLUE SHIELD | Admitting: Family

## 2018-07-07 ENCOUNTER — Other Ambulatory Visit (INDEPENDENT_AMBULATORY_CARE_PROVIDER_SITE_OTHER): Payer: Self-pay | Admitting: Family

## 2018-07-11 ENCOUNTER — Ambulatory Visit (INDEPENDENT_AMBULATORY_CARE_PROVIDER_SITE_OTHER): Payer: BLUE CROSS/BLUE SHIELD | Admitting: Family

## 2018-07-11 ENCOUNTER — Encounter (INDEPENDENT_AMBULATORY_CARE_PROVIDER_SITE_OTHER): Payer: Self-pay | Admitting: Family

## 2018-07-11 VITALS — BP 108/64 | HR 80 | Ht 67.56 in | Wt 137.2 lb

## 2018-07-11 DIAGNOSIS — R739 Hyperglycemia, unspecified: Secondary | ICD-10-CM

## 2018-07-11 DIAGNOSIS — E109 Type 1 diabetes mellitus without complications: Secondary | ICD-10-CM

## 2018-07-11 DIAGNOSIS — E11649 Type 2 diabetes mellitus with hypoglycemia without coma: Secondary | ICD-10-CM | POA: Diagnosis not present

## 2018-07-11 DIAGNOSIS — Z4681 Encounter for fitting and adjustment of insulin pump: Secondary | ICD-10-CM

## 2018-07-11 LAB — POCT GLUCOSE (DEVICE FOR HOME USE): POC Glucose: 240 mg/dl — AB (ref 70–99)

## 2018-07-11 NOTE — Patient Instructions (Signed)
Bolus before you eat   - If you forget to bolus, enter half the carbs   -Always have fast sugar with you in case of low blood sugar (glucose tabs, regular juice or soda, candy) -Always wear your ID that states you have diabetes -Always bring your meter to your visit -Call/Email if you want to review blood sugars

## 2018-07-11 NOTE — Progress Notes (Signed)
Pediatric Endocrinology Diabetes Consultation Follow-up Visit  Katelyn Lutz 08-Jun-1995 [id number]  Chief Complaint: Follow-up type 1 diabetes   Katelyn Lung, MD   HPI: Katelyn Lutz  is a 23 y.o. female presenting for follow-up of type 1 diabetes. she is accompanied to this visit by her mother.  Katelyn Lutz was diagnosed with new onset type 1 diabetes on 10/12/12. She had completed a course of antibiotics for a facial laceration and had developed oral thrush. She was seen at Urgent Care where they questioned why a previously healthy young adult would have thrush. They obtained a finger stick BG which was too high to read on the POC BG meter. They then obtained serum labs and urine which were consistent with new onset diabetes. She was then sent to [facility] for admission. On presentation she was acidotic with a pH of 7.2. She was admitted for treatment and management of DKA and new-onset T1DM. She was started on a multiple daily injection (MDI) of Lantus and Novolog insulins. She was subsequently converted to a Medtronic 530G insulin pump and Enlite CGM sensor on 08/07/13. Later, however, she converted from the [facility] sensor to the Dexcom G5 sensor that communicates with her i-phone  2. Since last visit to [facility] on 05/2018, she has been well.  No ER visits or hospitalizations.   Katelyn Lutz is on break from [employer], she was recently accepted to the nursing program. She is using Tandem insulin pump with Dexcom CGM. She recently updated her pump to control IQ therapy. She reports that since the upgrade she is having frequent hypoglycemia when the pump gives her additional insulin. She is frustrated.    Insulin regimen:    Basal Rates 12AM 0.95   4am 1.05  8am 1.10  12pm 1.20  9pm 1.35    Insulin to Carbohydrate Ratio 12AM 15  6am 10  12pm 11          Insulin Sensitivity Factor 12AM 45  10pm 65            Target Blood Glucose 12AM 150  6am 130  10pm 150            Hypoglycemia: Able to feel low blood sugars.  No glucagon needed recently.  Insulin Pump download:   - Using 51 units per day   - 54% basal and 46% bolus   Dexcom CGM Download:   - Avg Bg 160   - Target Range; IN target 31%, above target 50% and below target 19%   - She is frequently having hypoglycemia post prandial and overnight. Most hypoglycemia is occurring due to additonal insulin given by pump while in Control IQ.  Med-alert ID: Bracelet on wrist. Injection sites: Using abdomen and leg Annual labs due: NEXT VISIT.  Ophthalmology due:11/2018     3. ROS: Greater than 10 systems reviewed with pertinent positives listed in HPI, otherwise neg. Review of Systems  Constitutional: Negative for malaise/fatigue.  HENT: Negative.   Eyes: Negative for blurred vision, photophobia and pain.       Wears glasses  Respiratory: Negative for cough and shortness of breath.   Cardiovascular: Negative for chest pain and palpitations.  Gastrointestinal: Negative for abdominal pain, constipation, diarrhea and nausea.  Genitourinary: Negative for frequency and urgency.  Musculoskeletal: Negative for neck pain.  Skin: Negative for itching and rash.  Neurological: Negative for dizziness, tingling, tremors, sensory change, seizures, weakness and headaches.  Endo/Heme/Allergies: Negative for polydipsia.  Psychiatric/Behavioral: Negative for depression. The patient is not nervous/anxious.  Past Medical History:   Past Medical History:  Diagnosis Date  . Diabetes mellitus without complication ([place])   . New onset type 1 diabetes mellitus, uncontrolled ([place]) 10/25/2012  . Pigmentation abnormality of skin    no pigment in eyelids or genital area    Medications:  Outpatient Encounter Medications as of 07/11/2018  Medication Sig  . ACCU-CHEK FASTCLIX LANCETS MISC Check sugar 10 x daily. Lancets come in boxes of 102 ea.. Please dispense 9 boxes.  . Continuous Blood Gluc Sensor (DEXCOM G6  SENSOR) MISC Inject 1 kit into the skin daily as needed.  . Continuous Blood Gluc Transmit (DEXCOM G6 TRANSMITTER) MISC 1 kit.  Marland Kitchen glucagon (GLUCAGON EMERGENCY) 1 MG injection USE FOR SEVERE HYPOGLYCEMIA, INJECT 1MG INTRAMUSCULARLY IF UNRESPONSIVE OR UNCONCIOUS  . insulin aspart (NOVOLOG) 100 UNIT/ML injection USE 300 UNITS IN INSULIN PUMP EVERY 48 HOURS  . Urine Glucose-Ketones Test STRP Use to check urine in cases of hyperglycemia. Dispense 2 vials of 50 each  . BAYER CONTOUR NEXT TEST test strip CHECK BLOOD SUGAR 10 X DAILY AS DIRECTED. PT IS ON INSULIN PUMP. (Patient not taking: Reported on 07/11/2018)   No facility-administered encounter medications on file as of 07/11/2018.     Allergies: No Known Allergies  Surgical History: No past surgical history on file.  Family History:  Family History  Problem Relation Age of Onset  . Diabetes Other        father's maternal grandmother had type 1 diabetes  . Hypertension Maternal Aunt   . Asthma Maternal Uncle   . Asthma Paternal Aunt   . Hypertension Maternal Grandfather   . Thyroid disease Paternal Grandmother       Social History: Lives with: Mother and father   Sophomore  year at [employer] Physical Exam:  Vitals:   07/11/18 1136  BP: 108/64  Pulse: 80  Weight: 137 lb 3.2 oz (62.2 kg)  Height: 5' 7.56" (1.716 m)   BP 108/64   Pulse 80   Ht 5' 7.56" (1.716 m)   Wt 137 lb 3.2 oz (62.2 kg)   BMI 21.13 kg/m  Body mass index: body mass index is 21.13 kg/m. Growth percentile SmartLinks can only be used for patients less than 67 years old.  Ht Readings from Last 3 Encounters:  07/11/18 5' 7.56" (1.716 m)  05/04/18 5' 7.52" (1.715 m)  05/04/18 5' 7.52" (1.715 m)   Wt Readings from Last 3 Encounters:  07/11/18 137 lb 3.2 oz (62.2 kg)  05/04/18 135 lb 12.9 oz (61.6 kg)  05/04/18 135 lb 12.8 oz (61.6 kg)   Physical Exam   General: Well developed, well nourished female in no acute distress.  Alert and oriented.  Head:  Normocephalic, atraumatic.   Eyes:  Pupils equal and round. EOMI.   Sclera white.  No eye drainage.   Ears/Nose/Mouth/Throat: Nares patent, no nasal drainage.  Normal dentition, mucous membranes moist.   Neck: supple, no cervical lymphadenopathy, no thyromegaly Cardiovascular: regular rate, normal S1/S2, no murmurs Respiratory: No increased work of breathing.  Lungs clear to auscultation bilaterally.  No wheezes. Abdomen: soft, nontender, nondistended. Normal bowel sounds.  No appreciable masses  Extremities: warm, well perfused, cap refill < 2 sec.   Musculoskeletal: Normal muscle mass.  Normal strength Skin: warm, dry.  No rash or lesions. + pump site and CGM.  Neurologic: alert and oriented, normal speech, no tremor        Labs:  Lab Results  Component Value Date   HGBA1C  6.6 (A) 05/04/2018   Results for orders placed or performed in visit on 07/11/18  POCT Glucose (Device for Home Use)  Result Value Ref Range   Glucose Fasting, POC     POC Glucose 240 (A) 70 - 99 mg/dl    Assessment/Plan: Henriette is a 23 y.o. female with Type 1 diabetes in good control on insulin pump and CGM therapy. She is having more hypoglycemia which appears to be a problem with her control IQ settings. WIll reduce ISF to help. Also needs to bolus before eating to stop blood sugar spikes.   1. Type 1 Diabetes w/o complication/Hyperglycmia/hypoglycemia   - Reviewed insulin pump and CGM download. Discussed patterns and trends.  -A dvised that if she is bolusing after she eats and the pump has already given correciton insulin, she should reduce the carb input to prevent hypoglycemia.  - bolus before eating - Rotate injection sites to prevent scar tissue.  - Discussed signs and symptoms of hypoglycemia. Advised to always carry glucose  - Wear medical alert ID  - POCt glucose   2. Insulin Pump Titration/Insulin pump in place    - I spent extensive time reviewing glucose download, CGM download and carb  intake to make changes to insulin dosages.   nsulin Sensitivity Factor 12AM 45--> 50   10pm 65            - Also changed max daily dose in control IQ setting to 45 units.   Follow-up:   3 months   I have spent >25  minutes with >50% of time in counseling, education and instruction. When a patient is on insulin, intensive monitoring of blood glucose levels is necessary to avoid hyperglycemia and hypoglycemia. Severe hyperglycemia/hypoglycemia can lead to hospital admissions and be life threatening.      Hermenia Bers,  FNP-C  Pediatric Specialist  [address] [place]  [place], [address]  Tele: [phone]

## 2018-07-17 ENCOUNTER — Telehealth (INDEPENDENT_AMBULATORY_CARE_PROVIDER_SITE_OTHER): Payer: Self-pay | Admitting: Family

## 2018-07-17 NOTE — Telephone Encounter (Signed)
°  Who's calling (name and relationship to patient) : [employer] Best contact number: [phone] Provider they see: Ovidio Kin Reason for call:  [employer] has questions about medications that Spenser has prescribed.  Please call. Ref # [id number]   PRESCRIPTION REFILL ONLY  Name of prescription:  Pharmacy:

## 2018-07-17 NOTE — Telephone Encounter (Signed)
[facility] called back to confirm order for T-slim 76mL cartridge and [employer] XC infusion sets.

## 2018-07-17 NOTE — Telephone Encounter (Signed)
Attempted to call, but number was only for recording for ads.

## 2018-09-06 ENCOUNTER — Encounter: Payer: Self-pay | Admitting: Family Medicine

## 2018-09-07 ENCOUNTER — Telehealth: Payer: Self-pay | Admitting: Family Medicine

## 2018-09-07 DIAGNOSIS — Z20822 Contact with and (suspected) exposure to covid-19: Secondary | ICD-10-CM

## 2018-09-07 DIAGNOSIS — Z20828 Contact with and (suspected) exposure to other viral communicable diseases: Secondary | ICD-10-CM

## 2018-09-07 NOTE — Telephone Encounter (Signed)
Pt called and is requesting a covid antibody test done, there is no orders in there, please advise pt can be reached at [phone]

## 2018-09-07 NOTE — Telephone Encounter (Signed)
appt made. KH 

## 2018-09-07 NOTE — Telephone Encounter (Signed)
Schedule schedule her to come in

## 2018-09-12 ENCOUNTER — Other Ambulatory Visit: Payer: BLUE CROSS/BLUE SHIELD

## 2018-09-12 ENCOUNTER — Other Ambulatory Visit: Payer: Self-pay

## 2018-09-12 DIAGNOSIS — Z20822 Contact with and (suspected) exposure to covid-19: Secondary | ICD-10-CM

## 2018-09-12 DIAGNOSIS — Z20828 Contact with and (suspected) exposure to other viral communicable diseases: Secondary | ICD-10-CM

## 2018-09-13 LAB — SAR COV2 SEROLOGY (COVID19)AB(IGG),IA: SARS-CoV-2 Ab, IgG: NEGATIVE

## 2018-10-10 ENCOUNTER — Ambulatory Visit: Payer: BC Managed Care – PPO | Admitting: Family Medicine

## 2018-10-10 ENCOUNTER — Encounter: Payer: Self-pay | Admitting: Family Medicine

## 2018-10-10 ENCOUNTER — Other Ambulatory Visit: Payer: Self-pay

## 2018-10-10 VITALS — BP 112/72 | HR 81 | Temp 98.2°F | Ht 68.0 in | Wt 133.4 lb

## 2018-10-10 DIAGNOSIS — Z0000 Encounter for general adult medical examination without abnormal findings: Secondary | ICD-10-CM | POA: Diagnosis not present

## 2018-10-10 DIAGNOSIS — E109 Type 1 diabetes mellitus without complications: Secondary | ICD-10-CM

## 2018-10-10 DIAGNOSIS — Z111 Encounter for screening for respiratory tuberculosis: Secondary | ICD-10-CM | POA: Diagnosis not present

## 2018-10-10 NOTE — Progress Notes (Signed)
   Subjective:    Patient ID: Katelyn Lutz, female    DOB: [birth year], 23 y.o.   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  She does have type 1 diabetes and keeps this under excellent control.  Her last hemoglobin A1c was 6.6.  She does not smoke and rarely drinks.  She is not sexually active.  Her menses are regular.  She watches her diet and exercises.  Family and social history as well as health maintenance and immunizations was reviewed.   Review of Systems     Objective:   Physical Exam Alert and in no distress. Tympanic membranes and canals are normal. Pharyngeal area is normal. Neck is supple without adenopathy or thyromegaly. Cardiac exam shows a regular sinus rhythm without murmurs or gallops. Lungs are clear to auscultation.  Abdominal exam shows no masses or tenderness.       Assessment & Plan:  Routine general medical examination at a health care facility  Screening for tuberculosis - Plan: QuantiFERON-TB Gold Plus  Type 1 diabetes mellitus without complications ([place]) Immunization record was given to her.  QuantiFERON ordered for TB screening.  She is doing an excellent job of taking care of herself and I reinforced that.  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

## 2018-10-11 ENCOUNTER — Encounter (INDEPENDENT_AMBULATORY_CARE_PROVIDER_SITE_OTHER): Payer: Self-pay | Admitting: Family

## 2018-10-11 ENCOUNTER — Ambulatory Visit (INDEPENDENT_AMBULATORY_CARE_PROVIDER_SITE_OTHER): Payer: BC Managed Care – PPO | Admitting: Family

## 2018-10-11 VITALS — BP 120/84 | HR 64 | Ht 68.0 in | Wt 132.0 lb

## 2018-10-11 DIAGNOSIS — R739 Hyperglycemia, unspecified: Secondary | ICD-10-CM | POA: Diagnosis not present

## 2018-10-11 DIAGNOSIS — E109 Type 1 diabetes mellitus without complications: Secondary | ICD-10-CM | POA: Diagnosis not present

## 2018-10-11 DIAGNOSIS — Z4681 Encounter for fitting and adjustment of insulin pump: Secondary | ICD-10-CM | POA: Diagnosis not present

## 2018-10-11 DIAGNOSIS — E11649 Type 2 diabetes mellitus with hypoglycemia without coma: Secondary | ICD-10-CM

## 2018-10-11 LAB — POCT GLYCOSYLATED HEMOGLOBIN (HGB A1C): Hemoglobin A1C: 6.3 % — AB (ref 4.0–5.6)

## 2018-10-11 LAB — POCT GLUCOSE (DEVICE FOR HOME USE): POC Glucose: 352 mg/dl — AB (ref 70–99)

## 2018-10-11 LAB — POCT URINALYSIS DIPSTICK: Glucose, UA: POSITIVE — AB

## 2018-10-11 NOTE — Patient Instructions (Signed)
-  Always have fast sugar with you in case of low blood sugar (glucose tabs, regular juice or soda, candy) -Always wear your ID that states you have diabetes -Always bring your meter to your visit -Call/Email if you want to review blood sugars   

## 2018-10-11 NOTE — Progress Notes (Signed)
Pediatric Endocrinology Diabetes Consultation Follow-up Visit  Katelyn Lutz 10-05-1995 [id number]  Chief Complaint: Follow-up type 1 diabetes   [name], MD   HPI: Katelyn Lutz  is a 23 y.o. female presenting for follow-up of type 1 diabetes. she is accompanied to this visit by her mother.  [place] was diagnosed with new onset type 1 diabetes on 10/12/12. She had completed a course of antibiotics for a facial laceration and had developed oral thrush. She was seen at Urgent Care where they questioned why a previously healthy young adult would have thrush. They obtained a finger stick BG which was too high to read on the POC BG meter. They then obtained serum labs and urine which were consistent with new onset diabetes. She was then sent to [facility] for admission. On presentation she was acidotic with a pH of 7.2. She was admitted for treatment and management of DKA and new-onset T1DM. She was started on a multiple daily injection (MDI) of Lantus and Novolog insulins. She was subsequently converted to a Medtronic 530G insulin pump and Enlite CGM sensor on 08/07/13. Later, however, she converted from the [facility] sensor to the Dexcom G5 sensor that communicates with her i-phone  2. Since last visit to [facility] on 07/2018, she has been well.  No ER visits or hospitalizations.   She is home from [place], school finished with online classes due to COVID 19. Currently using Tandem Tslim insulin pump and Dexcom CGM, she is using Control IQ. She feels like her blood sugars have been much better lately on control IQ. She occasionally forgets to bolus before eating at breakfast and will be high for a few hours afterward. When she does go low it is usually around 2am.   Insulin regimen:    Basal Rates 12AM 0.95   4am 1.00  8am 1.10  12pm 1.20  9pm 1.35    Insulin to Carbohydrate Ratio 12AM 15  6am 10  12pm 11          Insulin Sensitivity Factor 12AM 50  10pm 65            Target Blood  Glucose 12AM 150  6am 130  10pm 150           Hypoglycemia: Able to feel low blood sugars.  No glucagon needed recently.  Insulin Pump and CGm download:  - Avg Bg 153 - Target Range: in target 50%, above target 46% and below target 4%  - Using 46 units per day.   - 59% basal and 41% bolus  Med-alert ID: Bracelet on wrist. Injection sites: Using abdomen and leg Annual labs due: Done today. 10/2018  Ophthalmology due:11/2018     3. ROS: Greater than 10 systems reviewed with pertinent positives listed in HPI, otherwise neg. Review of Systems  Constitutional: Negative for malaise/fatigue.  HENT: Negative.   Eyes: Negative for blurred vision, photophobia and pain.       Wears glasses  Respiratory: Negative for cough and shortness of breath.   Cardiovascular: Negative for chest pain and palpitations.  Gastrointestinal: Negative for abdominal pain, constipation, diarrhea and nausea.  Genitourinary: Negative for frequency and urgency.  Musculoskeletal: Negative for neck pain.  Skin: Negative for itching and rash.  Neurological: Negative for dizziness, tingling, tremors, sensory change, seizures, weakness and headaches.  Endo/Heme/Allergies: Negative for polydipsia.  Psychiatric/Behavioral: Negative for depression. The patient is not nervous/anxious.      Past Medical History:   Past Medical History:  Diagnosis Date  .  Diabetes mellitus without complication ([place])   . New onset type 1 diabetes mellitus, uncontrolled ([place]) 10/25/2012  . Pigmentation abnormality of skin    no pigment in eyelids or genital area    Medications:  Outpatient Encounter Medications as of 10/11/2018  Medication Sig  . ACCU-CHEK FASTCLIX LANCETS MISC Check sugar 10 x daily. Lancets come in boxes of 102 ea.. Please dispense 9 boxes.  Marland Kitchen BAYER CONTOUR NEXT TEST test strip CHECK BLOOD SUGAR 10 X DAILY AS DIRECTED. PT IS ON INSULIN PUMP. (Patient not taking: Reported on 07/11/2018)  . Continuous Blood Gluc  Sensor (DEXCOM G6 SENSOR) MISC Inject 1 kit into the skin daily as needed.  . Continuous Blood Gluc Transmit (DEXCOM G6 TRANSMITTER) MISC 1 kit.  Marland Kitchen glucagon (GLUCAGON EMERGENCY) 1 MG injection USE FOR SEVERE HYPOGLYCEMIA, INJECT 1MG INTRAMUSCULARLY IF UNRESPONSIVE OR UNCONCIOUS  . insulin aspart (NOVOLOG) 100 UNIT/ML injection USE 300 UNITS IN INSULIN PUMP EVERY 48 HOURS  . Urine Glucose-Ketones Test STRP Use to check urine in cases of hyperglycemia. Dispense 2 vials of 50 each   No facility-administered encounter medications on file as of 10/11/2018.     Allergies: No Known Allergies  Surgical History: No past surgical history on file.  Family History:  Family History  Problem Relation Age of Onset  . Diabetes Other        father's maternal grandmother had type 1 diabetes  . Hypertension Maternal Aunt   . Asthma Maternal Uncle   . Asthma Paternal Aunt   . Hypertension Maternal Grandfather   . Thyroid disease Paternal Grandmother       Social History: Lives with: Mother and father   Sophomore  year at [employer] Physical Exam:  There were no vitals filed for this visit. There were no vitals taken for this visit. Body mass index: body mass index is unknown because there is no height or weight on file. Growth percentile SmartLinks can only be used for patients less than 58 years old.  Ht Readings from Last 3 Encounters:  10/10/18 5' 8"  (1.727 m)  07/11/18 5' 7.56" (1.716 m)  05/04/18 5' 7.52" (1.715 m)   Wt Readings from Last 3 Encounters:  10/10/18 133 lb 6.4 oz (60.5 kg)  07/11/18 137 lb 3.2 oz (62.2 kg)  05/04/18 135 lb 12.9 oz (61.6 kg)   Physical Exam   General: Well developed, well nourished female in no acute distress.  Alert and oriented.  Head: Normocephalic, atraumatic.   Eyes:  Pupils equal and round. EOMI.   Sclera white.  No eye drainage.   Ears/Nose/Mouth/Throat: Nares patent, no nasal drainage.  Normal dentition, mucous membranes moist.   Neck: supple, no  cervical lymphadenopathy, no thyromegaly Cardiovascular: regular rate, normal S1/S2, no murmurs Respiratory: No increased work of breathing.  Lungs clear to auscultation bilaterally.  No wheezes. Abdomen: soft, nontender, nondistended. Normal bowel sounds.  No appreciable masses  Extremities: warm, well perfused, cap refill < 2 sec.   Musculoskeletal: Normal muscle mass.  Normal strength Skin: warm, dry.  No rash or lesions. Neurologic: alert and oriented, normal speech, no tremor    Labs:  Results for orders placed or performed in visit on 10/11/18  POCT glycosylated hemoglobin (Hb A1C)  Result Value Ref Range   Hemoglobin A1C 6.3 (A) 4.0 - 5.6 %   HbA1c POC (<> result, manual entry)     HbA1c, POC (prediabetic range)     HbA1c, POC (controlled diabetic range)    POCT Glucose (Device for  Home Use)  Result Value Ref Range   Glucose Fasting, POC     POC Glucose 352 (A) 70 - 99 mg/dl  POCT urinalysis dipstick  Result Value Ref Range   Color, UA     Clarity, UA     Glucose, UA Positive (A) Negative   Bilirubin, UA     Ketones, UA small    Spec Grav, UA     Blood, UA     pH, UA     Protein, UA     Urobilinogen, UA     Nitrite, UA     Leukocytes, UA     Appearance     Odor       Assessment/Plan: Jaymes is a 23 y.o. female with Type 1 diabetes in good control on insulin pump and CGM therapy. She is doing well on control IQ therapy. Her hemoglobin A1c is 6.3% which meets the ADA goal of <7%. Pattern of hypoglycemia between 2am-3am.   1. Type 1 Diabetes w/o complication/Hyperglycmia/hypoglycemia   -Reviewed insulin pump and CGm download. Discussed trends and patterns.  - Rotate pump site to prevent scar tissue.  - BOlus 15 minutes before eating to limit blood sugar spikes.  - Discussed signs and symptoms of hypoglycemia. Advised to always have sugar available.  - Reviewed growth chart.  - POCT glucose and hemoglobin A1c  - Labs ordered. TFTS, lipid panel and  Microalbumin.   2. Insulin Pump Titration/Insulin pump in place    - I spent extensive time reviewing glucose download, CGM download and carb intake to make changes to insulin dosages.   Basal Rates 12AM 0.95--> 0.90    4am 1.00--> 0.95   8am 1.10  12pm 1.20  9pm 1.35--> 1.30     Follow-up:   3 months   I have spent >40 minutes with >50% of time in counseling, education and instruction. When a patient is on insulin, intensive monitoring of blood glucose levels is necessary to avoid hyperglycemia and hypoglycemia. Severe hyperglycemia/hypoglycemia can lead to hospital admissions and be life threatening.     Hermenia Bers,  FNP-C  Pediatric Specialist  [address] [place]  [place], [address]  Tele: [phone]

## 2018-10-12 LAB — LIPID PANEL
Cholesterol: 143 mg/dL (ref <200)
HDL: 59 mg/dL (ref >=50)
LDL Cholesterol (Calc): 71 mg/dL (calc)
Non-HDL Cholesterol (Calc): 84 mg/dL (calc) (ref <130)
Total CHOL/HDL Ratio: 2.4 (calc) (ref <5.0)
Triglycerides: 47 mg/dL (ref <150)

## 2018-10-12 LAB — MICROALBUMIN / CREATININE URINE RATIO
Creatinine, Urine: 77 mg/dL (ref 20–275)
Microalb Creat Ratio: 4 mcg/mg creat (ref <30)
Microalb, Ur: 0.3 mg/dL

## 2018-10-12 LAB — TSH: TSH: 1.05 mIU/L

## 2018-10-12 LAB — T4, FREE: Free T4: 1 ng/dL (ref 0.8–1.4)

## 2018-10-15 LAB — QUANTIFERON-TB GOLD PLUS
QuantiFERON Mitogen Value: >10 IU/mL
QuantiFERON Nil Value: 0.02 IU/mL
QuantiFERON TB1 Ag Value: 0.02 IU/mL
QuantiFERON TB2 Ag Value: 0.02 IU/mL
QuantiFERON-TB Gold Plus: NEGATIVE

## 2018-10-19 ENCOUNTER — Telehealth: Payer: Self-pay | Admitting: Family Medicine

## 2018-10-19 NOTE — Telephone Encounter (Signed)
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

## 2018-10-20 NOTE — Telephone Encounter (Signed)
Orde rit

## 2018-10-24 ENCOUNTER — Other Ambulatory Visit: Payer: Self-pay

## 2018-10-24 DIAGNOSIS — Z9229 Personal history of other drug therapy: Secondary | ICD-10-CM

## 2018-10-24 NOTE — Telephone Encounter (Signed)
Order put in. KH °

## 2018-10-25 ENCOUNTER — Other Ambulatory Visit: Payer: BC Managed Care – PPO

## 2018-10-25 ENCOUNTER — Other Ambulatory Visit: Payer: Self-pay

## 2018-10-26 LAB — VARICELLA ZOSTER ANTIBODY, IGM: Varicella IgM: <0.91 index (ref 0.00–0.90)

## 2018-11-13 LAB — SPECIMEN STATUS REPORT

## 2018-11-13 LAB — VARICELLA ZOSTER ANTIBODY, IGG: Varicella zoster IgG: 437 index (ref >165)

## 2018-12-24 IMAGING — CR DG LUMBAR SPINE COMPLETE 4+V
5 series · 5 of 5 positions shown · non-contrast
Comparison: None in PACs

CLINICAL DATA: Onset of low back pain several years ago with
intermittent episodes since. No radicular symptoms.

EXAM:
LUMBAR SPINE - COMPLETE 4+ VIEW

[w lumbar spine ap]
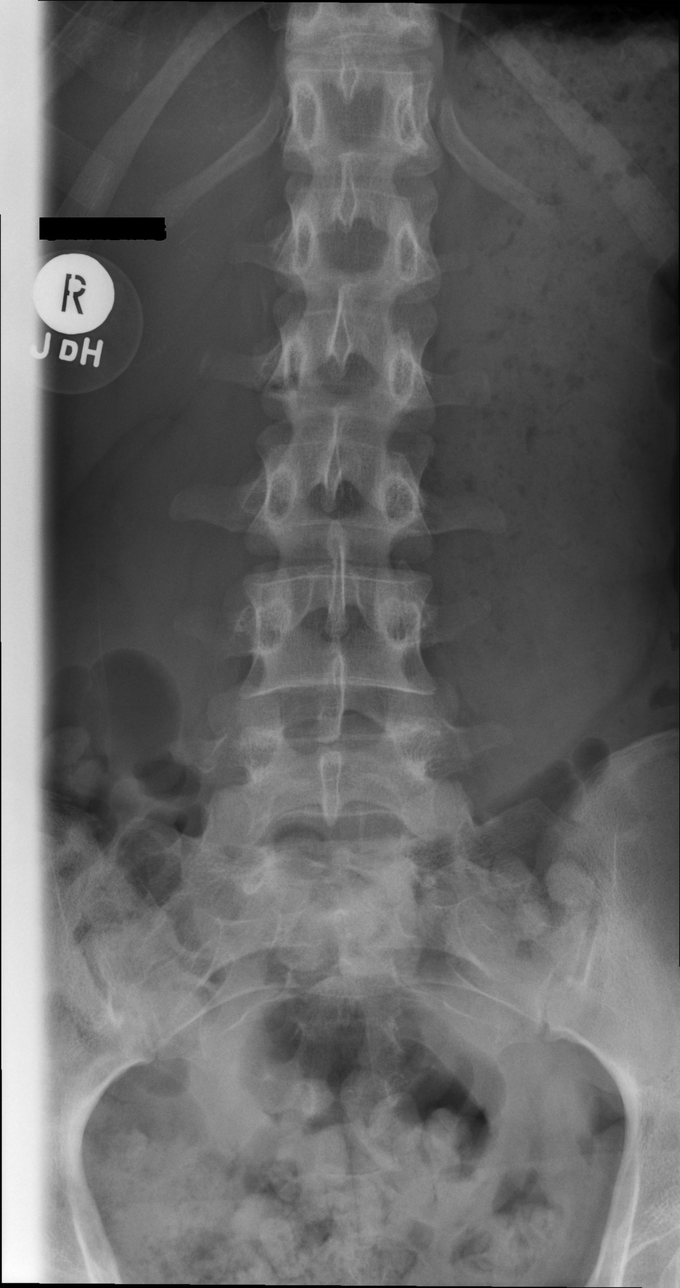

[w lumbar spine obl (1 of 2)]
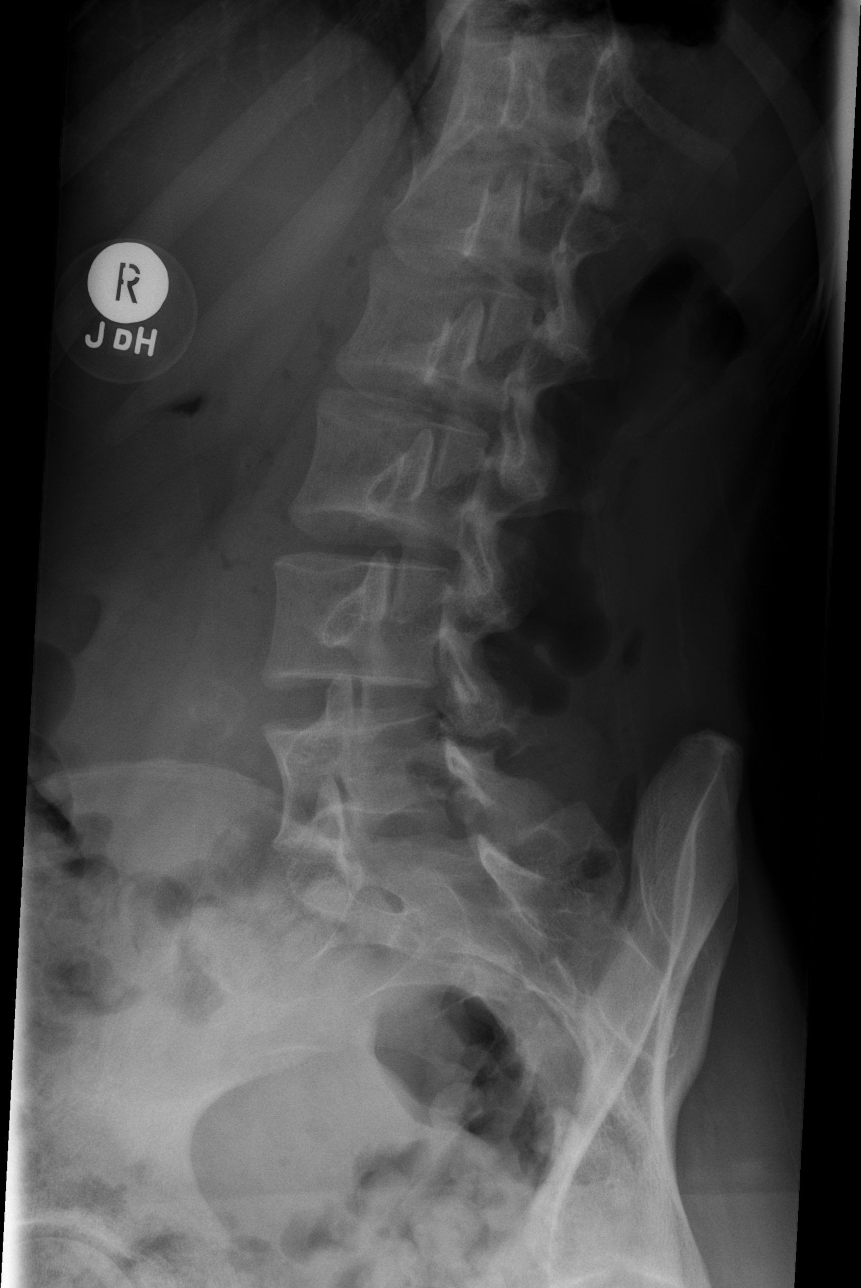

[w lumbar spine obl (2 of 2)]
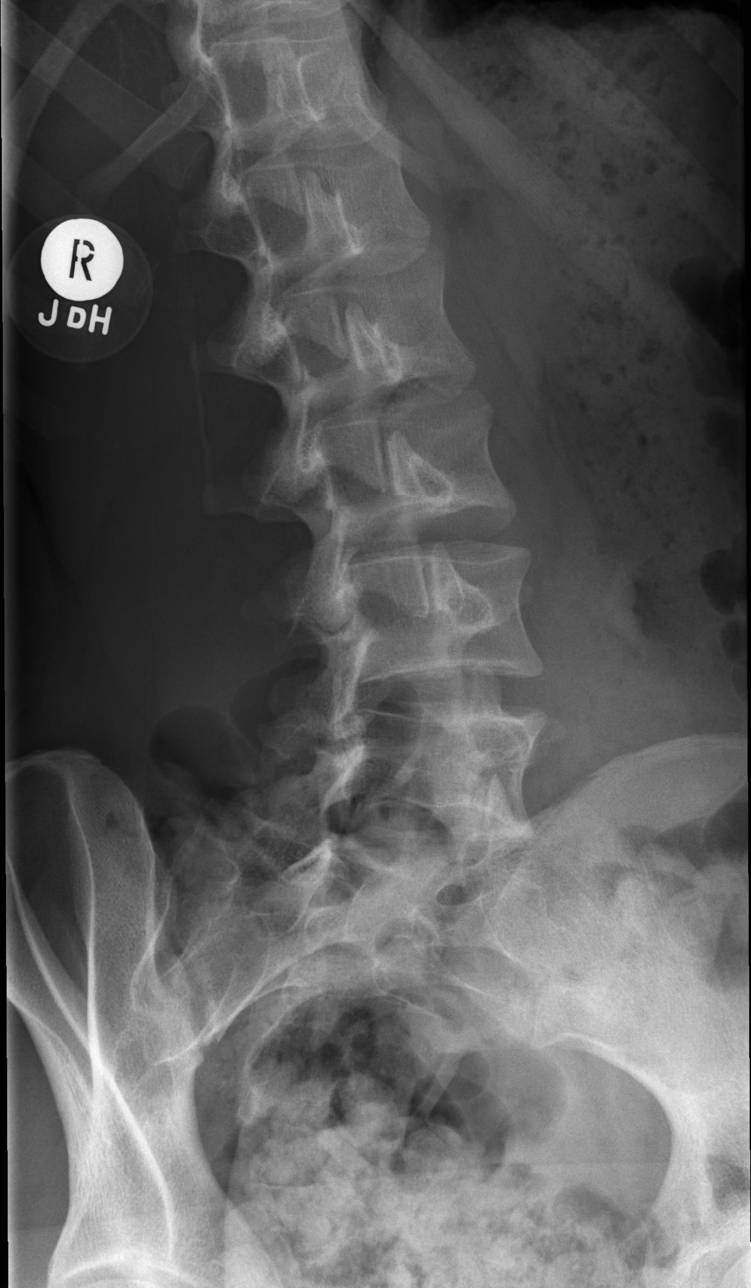

[w lumbar spine lat]
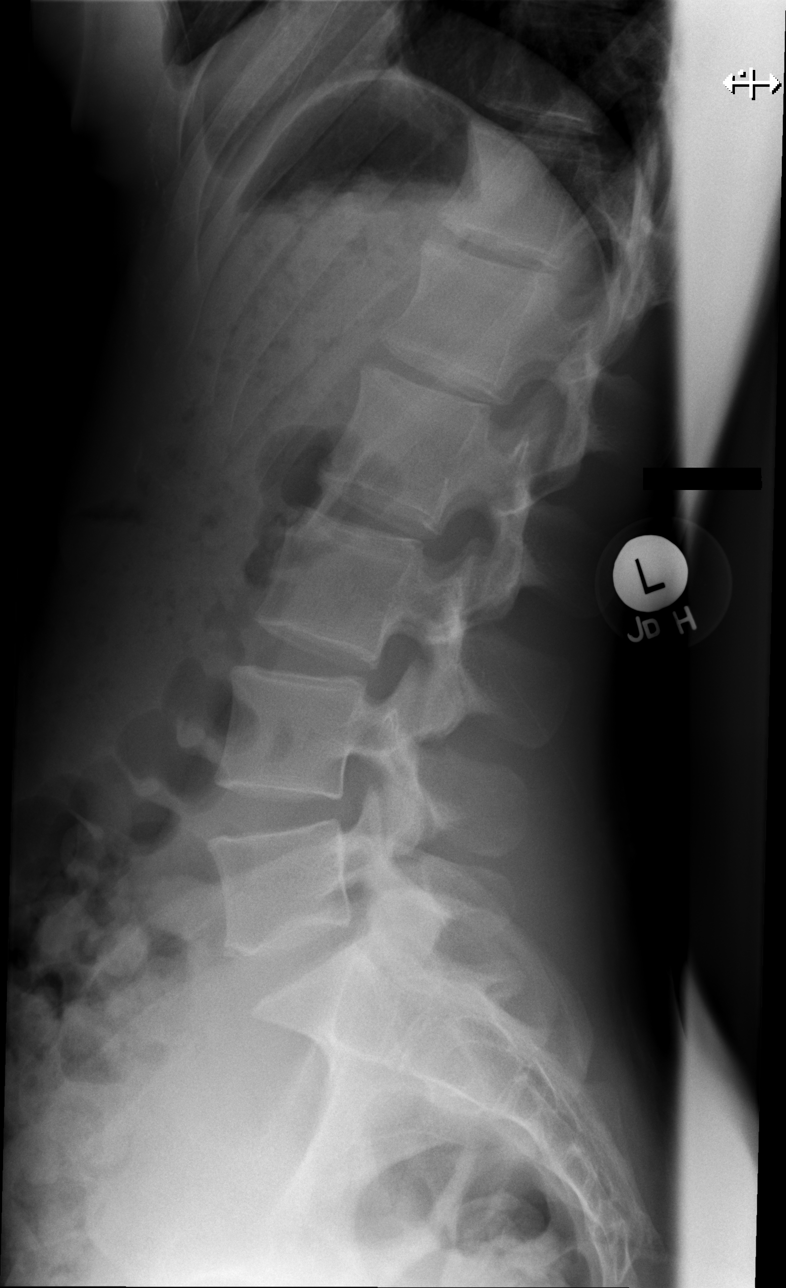

[w lumbar l-5 s-1 spot]
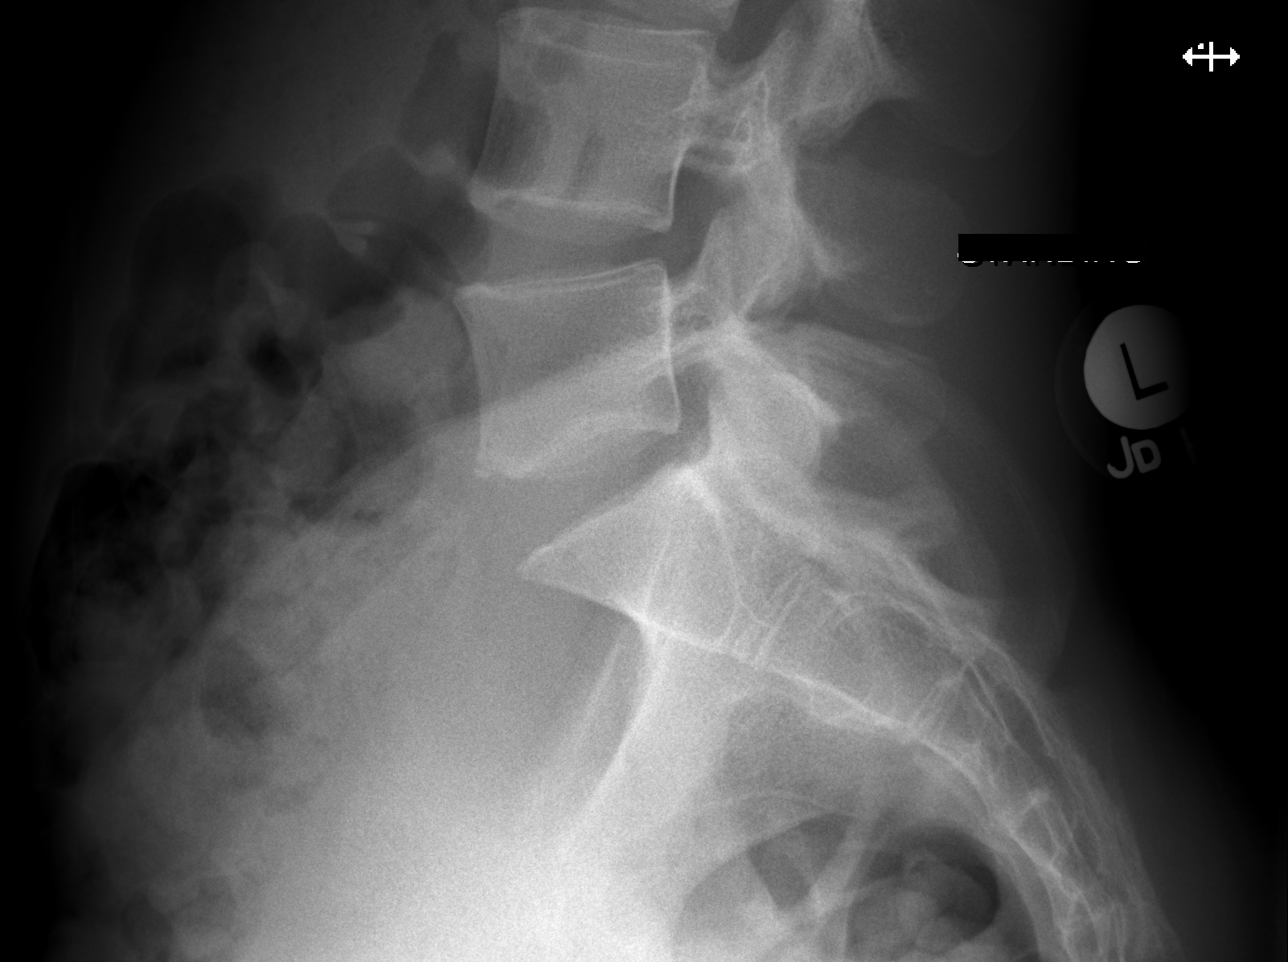

[5 of 5 positions shown; findings below may reference images not displayed]

FINDINGS: The lumbar vertebral bodies are preserved in height. The disc space
are well maintained. There is no spondylolisthesis. The pedicles and
transverse processes are intact. The observed portions of the sacrum
are normal.
IMPRESSION: There is no acute or significant chronic bony abnormality of the
lumbar spine.

## 2019-01-10 ENCOUNTER — Other Ambulatory Visit (INDEPENDENT_AMBULATORY_CARE_PROVIDER_SITE_OTHER): Payer: Self-pay | Admitting: Family

## 2019-04-03 ENCOUNTER — Ambulatory Visit (INDEPENDENT_AMBULATORY_CARE_PROVIDER_SITE_OTHER): Payer: BC Managed Care – PPO | Admitting: Family

## 2019-04-24 ENCOUNTER — Other Ambulatory Visit: Payer: Self-pay

## 2019-04-24 ENCOUNTER — Encounter (INDEPENDENT_AMBULATORY_CARE_PROVIDER_SITE_OTHER): Payer: Self-pay | Admitting: Pediatrics

## 2019-04-24 ENCOUNTER — Ambulatory Visit (INDEPENDENT_AMBULATORY_CARE_PROVIDER_SITE_OTHER): Payer: BC Managed Care – PPO | Admitting: Pediatrics

## 2019-04-24 VITALS — BP 110/70 | HR 80 | Ht 67.09 in | Wt 129.8 lb

## 2019-04-24 DIAGNOSIS — E109 Type 1 diabetes mellitus without complications: Secondary | ICD-10-CM

## 2019-04-24 DIAGNOSIS — Z4681 Encounter for fitting and adjustment of insulin pump: Secondary | ICD-10-CM | POA: Diagnosis not present

## 2019-04-24 LAB — POCT GLYCOSYLATED HEMOGLOBIN (HGB A1C): Hemoglobin A1C: 6.8 % — AB (ref 4.0–5.6)

## 2019-04-24 LAB — POCT GLUCOSE (DEVICE FOR HOME USE): POC Glucose: 176 mg/dl — AB (ref 70–99)

## 2019-04-24 MED ORDER — BAQSIMI TWO PACK 3 MG/DOSE NA POWD: 1.0000 "application " | NASAL | 1 refills | Status: AC | PRN

## 2019-04-24 MED ORDER — GVOKE PFS 1 MG/0.2ML ~~LOC~~ SOSY: 1.0000 mg | PREFILLED_SYRINGE | Freq: Once | SUBCUTANEOUS | 1 refills | Status: AC

## 2019-04-24 NOTE — Patient Instructions (Signed)

## 2019-04-24 NOTE — Progress Notes (Signed)
Pediatric Endocrinology Consultation Follow-up Visit  Katelyn Lutz 04-Jul-1995 [id number]   Chief Complaint: Follow-up of Type 1 diabetes  HPI: Katelyn Lutz  is a 23 y.o. female presenting for follow-up of the above concerns.  she attended this visit alone.  [place] was diagnosed with new onset type 1 diabetes on 10/12/12. She had completed a course of antibiotics for a facial laceration and had developed oral thrush. She was seen at Urgent Care where they questioned why a previously healthy young adult would have thrush. They obtained a finger stick BG which was too high to read on the POC BG meter. They then obtained serum labs and urine which were consistent with new onset diabetes. She was then sent to [facility] where she was diagnosed with T1DM and started on insulin.  Katelyn Lutz was last seen at [facility] on 10/11/2018.  Since last visit, she has been well.  Concerns:  -having frequent lows.  After lows, BG spikes then correction is automatically given resulting in another low.  Usually low before bed  Insulin regimen:     CGM download: Having frequent lows, sometimes overnight, sometimes late morning.  Needs less aggressive correction factor   Hypoglycemia: can feel low blood sugars.  No glucagon needed recently. Needs new rx for glucagon Wearing Med-alert ID currently: Yes  Injection sites: abdominal wall, buttock(s) and thigh(s) Annual labs due: 10/2019 Ophthalmology due: 04/19/2019- no problems  ROS:  All systems reviewed with pertinent positives listed below; otherwise negative. Constitutional: Weight down 3lb since last visit.  Attributes weight loss to sleeping later and skipping breakfast.  No abdominal pain or vomiting Respiratory: No increased work of breathing currently GI: No abd pain or vomiting Musculoskeletal: No joint deformity Neuro: Normal affect Endocrine: As above  Past Medical History:   Past Medical History:  Diagnosis Date  . Diabetes mellitus without  complication ([place])   . New onset type 1 diabetes mellitus, uncontrolled ([place]) 10/25/2012  . Pigmentation abnormality of skin    no pigment in eyelids or genital area    Meds: Outpatient Encounter Medications as of 04/24/2019  Medication Sig  . ACCU-CHEK FASTCLIX LANCETS MISC Check sugar 10 x daily. Lancets come in boxes of 102 ea.. Please dispense 9 boxes.  Marland Kitchen BAYER CONTOUR NEXT TEST test strip CHECK BLOOD SUGAR 10 X DAILY AS DIRECTED. PT IS ON INSULIN PUMP.  . Continuous Blood Gluc Sensor (DEXCOM G6 SENSOR) MISC Inject 1 kit into the skin daily as needed.  . Continuous Blood Gluc Transmit (DEXCOM G6 TRANSMITTER) MISC 1 kit.  Marland Kitchen glucagon (GLUCAGON EMERGENCY) 1 MG injection USE FOR SEVERE HYPOGLYCEMIA, INJECT 1MG INTRAMUSCULARLY IF UNRESPONSIVE OR UNCONCIOUS  . insulin aspart (NOVOLOG) 100 UNIT/ML injection USE 300 UNITS IN INSULIN PUMP EVERY 48 HOURS  . Insulin Infusion Pump (T:SLIM X2 INSULIN PMP BASAL IQ) DEVI   . Insulin Infusion Pump Supplies (AUTOSOFT XC INFUSION SET) MISC   . Urine Glucose-Ketones Test STRP Use to check urine in cases of hyperglycemia. Dispense 2 vials of 50 each  . Glucagon (BAQSIMI TWO PACK) 3 MG/DOSE POWD Place 1 application into the nose as needed. Use as directed if unconscious, unable to take food po, or having a seizure due to hypoglycemia  . GVOKE PFS 1 MG/0.2ML SOSY Inject 1 mg into the skin once for 1 dose. Inject if needed for severe low blood sugar and unable to take sugar orally.   No facility-administered encounter medications on file as of 04/24/2019.    Allergies: No Known Allergies  Surgical History: History reviewed. No pertinent surgical history.   Family History:  Family History  Problem Relation Age of Onset  . Diabetes Other        father's maternal grandmother had type 1 diabetes  . Hypertension Maternal Grandfather   . Thyroid disease Paternal Grandmother   . Hypertension Maternal Aunt   . Asthma Maternal Uncle   . Asthma Paternal  Aunt     Social History: Attends [employer], going to school for nursing  Physical Exam:  Vitals:   04/24/19 1449  BP: 110/70  Pulse: 80  Weight: 129 lb 12.8 oz (58.9 kg)  Height: 5' 7.09" (1.704 m)   BP 110/70   Pulse 80   Ht 5' 7.09" (1.704 m)   Wt 129 lb 12.8 oz (58.9 kg)   BMI 20.28 kg/m  Body mass index: body mass index is 20.28 kg/m. Growth percentile SmartLinks can only be used for patients less than 74 years old.  Wt Readings from Last 3 Encounters:  04/24/19 129 lb 12.8 oz (58.9 kg)  10/11/18 132 lb (59.9 kg)  10/10/18 133 lb 6.4 oz (60.5 kg)   Ht Readings from Last 3 Encounters:  04/24/19 5' 7.09" (1.704 m)  10/11/18 _0  (1.727 m)  10/10/18 _1  (1.727 m)    General: Well developed, well nourished female in no acute distress.  Appears stated age Head: Normocephalic, atraumatic.   Eyes:  Pupils equal and round. EOMI.   Sclera white.  No eye drainage.   Ears/Nose/Mouth/Throat: Wearing a mask Neck: supple, no cervical lymphadenopathy, no thyromegaly Cardiovascular: regular rate, normal S1/S2, no murmurs Respiratory: No increased work of breathing.  Lungs clear to auscultation bilaterally.  No wheezes. Abdomen: soft, nontender, nondistended.  Extremities: warm, well perfused, cap refill < 2 sec.   Musculoskeletal: Normal muscle mass.  Normal strength Skin: warm, dry.  No rash or lesions. Pump sites normal (pump on abdomen, CGM on R arm) Neurologic: alert and oriented, normal speech, no tremor  Labs: Results for orders placed or performed in visit on 04/24/19  POCT HgB A1C  Result Value Ref Range   Hemoglobin A1C 6.8 (A) 4.0 - 5.6 %   HbA1c POC (<> result, manual entry)     HbA1c, POC (prediabetic range)     HbA1c, POC (controlled diabetic range)    POCT Glucose (Device for Home Use)  Result Value Ref Range   Glucose Fasting, POC     POC Glucose 176 (A) 70 - 99 mg/dl    Assessment/Plan: Katelyn Lutz is a 23 y.o. female with well controlled T1DM  on a pump and CGM regimen (tanmdem control IQ).  A1c is slightly higher than last visit but remains below the ADA goal of <7%.  she needs less basal overnight and a less aggressive correction factor.     When a patient is on insulin, intensive monitoring of blood glucose levels and continuous insulin titration is vital to avoid insulin toxicity leading to severe hypoglycemia. Severe hypoglycemia can lead to seizure or death. Hyperglycemia can also result from inadequate insulin dosing and can lead to ketosis requiring ICU admission and intravenous insulin.   1. Type 1 diabetes mellitus without complications ([place]) - POCT Glucose and POCT HgB A1C as above -Will draw annual diabetes labs 10/2019 (lipid panel, TSH, FT4, urine microalbumin to creatinine ratio) -Encouraged to wear med alert ID every day -Encouraged to rotate injection sites -Provided with my contact information and advised to email/send mychart with questions/need for BG  review -CGM download reviewed extensively (see interpretation above) -Rx sent to pharmacy include: baqsimi and Gvoke.  Copay cards given.  Advised to get whichever insurance will cover  2. Insulin pump titration -Made the following pump changes: T-slim pump settings: Time Basal Rate Correction Factor Carb Ratio Target BG  12A 0.9-->0.85 50-->60 15 150  4A 0.95 50-->60 15 150  6A 1.050 45-->55 10 130  8A 1.1 50-->60 10 130  12P 1.2 50-->60 11 130  9P 1.35 50-->60 11 130  10P 1.3-->1.1 50-->60 11 150  Total Daily Basal: 26.8 units/day  Provided with a copy of old and new pump settings  Follow-up:   Return in about 3 months (around 07/23/2019).   Medical decision-making:  > 25 minutes spent, more than 50% of appointment was spent discussing diagnosis and management of symptoms  Levon Hedger, MD

## 2019-05-02 ENCOUNTER — Ambulatory Visit (INDEPENDENT_AMBULATORY_CARE_PROVIDER_SITE_OTHER): Payer: BC Managed Care – PPO | Admitting: Family

## 2019-05-10 LAB — HM PAP SMEAR

## 2019-07-12 ENCOUNTER — Other Ambulatory Visit (INDEPENDENT_AMBULATORY_CARE_PROVIDER_SITE_OTHER): Payer: Self-pay | Admitting: Family

## 2019-08-06 ENCOUNTER — Telehealth: Payer: Self-pay | Admitting: Internal Medicine

## 2019-08-06 NOTE — Telephone Encounter (Signed)
Pt has to look at school schedule before scheduling cpe after 6/9//2021

## 2019-08-19 ENCOUNTER — Encounter: Payer: Self-pay | Admitting: Family Medicine

## 2019-08-21 ENCOUNTER — Encounter: Payer: Self-pay | Admitting: Family Medicine

## 2019-08-22 MED ORDER — NITROFURANTOIN MONOHYD MACRO 100 MG PO CAPS: 100.0000 mg | ORAL_CAPSULE | Freq: Two times a day (BID) | ORAL | 0 refills | Status: DC

## 2019-09-20 ENCOUNTER — Other Ambulatory Visit (INDEPENDENT_AMBULATORY_CARE_PROVIDER_SITE_OTHER): Payer: Self-pay | Admitting: Family

## 2019-09-24 ENCOUNTER — Encounter (INDEPENDENT_AMBULATORY_CARE_PROVIDER_SITE_OTHER): Payer: Self-pay | Admitting: Family

## 2019-09-24 ENCOUNTER — Ambulatory Visit (INDEPENDENT_AMBULATORY_CARE_PROVIDER_SITE_OTHER): Payer: BC Managed Care – PPO | Admitting: Family

## 2019-09-24 ENCOUNTER — Other Ambulatory Visit: Payer: Self-pay

## 2019-09-24 VITALS — BP 112/70 | HR 98 | Wt 119.6 lb

## 2019-09-24 DIAGNOSIS — R739 Hyperglycemia, unspecified: Secondary | ICD-10-CM

## 2019-09-24 DIAGNOSIS — R634 Abnormal weight loss: Secondary | ICD-10-CM

## 2019-09-24 DIAGNOSIS — Z4681 Encounter for fitting and adjustment of insulin pump: Secondary | ICD-10-CM

## 2019-09-24 DIAGNOSIS — E109 Type 1 diabetes mellitus without complications: Secondary | ICD-10-CM | POA: Diagnosis not present

## 2019-09-24 DIAGNOSIS — E11649 Type 2 diabetes mellitus with hypoglycemia without coma: Secondary | ICD-10-CM

## 2019-09-24 LAB — POCT GLUCOSE (DEVICE FOR HOME USE): Glucose Fasting, POC: 152 mg/dL — AB (ref 70–99)

## 2019-09-24 LAB — POCT GLYCOSYLATED HEMOGLOBIN (HGB A1C): Hemoglobin A1C: 6.7 % — AB (ref 4.0–5.6)

## 2019-09-24 NOTE — Progress Notes (Signed)
Pediatric Endocrinology Consultation Follow-up Visit  MARCEL SORTER Sep 22, 1995 [id number]   Chief Complaint: Follow-up of Type 1 diabetes  HPI: [place]  is a 24 y.o. female presenting for follow-up of the above concerns.  she attended this visit alone.  [place] was diagnosed with new onset type 1 diabetes on 10/12/12. She had completed a course of antibiotics for a facial laceration and had developed oral thrush. She was seen at Urgent Care where they questioned why a previously healthy young adult would have thrush. They obtained a finger stick BG which was too high to read on the POC BG meter. They then obtained serum labs and urine which were consistent with new onset diabetes. She was then sent to [facility] where she was diagnosed with T1DM and started on insulin.  [place] was last seen at [place] on 04/2019.  Since last visit, she has been well.  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 She wants to get a job over the summer. Using Tslim insulin pump with Dexcom CGM. She reports that she was sick one week ago and did not eat for almost the entire week.   Concerns:  - Spiking after meals, not coming back down.  - Worst and lunch and breakfast.   Insulin regimen:   T-slim pump settings: Time Basal Rate Correction Factor Carb Ratio Target BG  12A 0.85 60 15 150  4A 0.95 60 15 150  6A 1.050 55 10 130  8A 1.1 60 10 130  12P 1.2 60 11 130  9P 1.35 60 11 130  10P 1.1 60 11 150  Total Daily Basal: 26.8 units/day  CGM download: Pump is setting for low alarm under 100. Time under 80 is much less frequent. Having blood sugar spike between 8am-2pm.    Hypoglycemia: can feel low blood sugars.  No glucagon needed recently. Needs new rx for glucagon Wearing Med-alert ID currently: Yes  Injection sites: abdominal wall, buttock(s) and thigh(s) Annual labs due: 10/2019 Ophthalmology due: 04/19/2019- no problems  ROS:  All systems reviewed with  pertinent positives listed below; otherwise negative. Constitutional: 10 lbs weight loss. Reports due to activity with nursing school and recent illness (1 week of cold symptoms with decreased appetite)  HEENT: No neck pain. No difficulty swallowing.  Respiratory: No increased work of breathing currently Cardiac: no tachycardia. No palpitations.  GI: No constipation or diarrhea Musculoskeletal: No joint deformity Neuro: Normal affect.No tremor.  Endocrine: As above  Past Medical History:   Past Medical History:  Diagnosis Date  . Diabetes mellitus without complication ([place])   . New onset type 1 diabetes mellitus, uncontrolled ([place]) 10/25/2012  . Pigmentation abnormality of skin    no pigment in eyelids or genital area    Meds: Outpatient Encounter Medications as of 09/24/2019  Medication Sig  . Continuous Blood Gluc Sensor (DEXCOM G6 SENSOR) MISC Inject 1 kit into the skin daily as needed.  . Continuous Blood Gluc Transmit (DEXCOM G6 TRANSMITTER) MISC 1 kit.  Marland Kitchen insulin aspart (NOVOLOG) 100 UNIT/ML injection USE 300 UNITS IN INSULIN PUMP EVERY 48 HOURS  . Insulin Infusion Pump Supplies (AUTOSOFT XC INFUSION SET) MISC CHANGE INFUSION SET EVERY  48-72 HOURS  . Norethin Ace-Eth Estrad-FE 1-20 MG-MCG(24) CAPS Take 1 tablet by mouth daily.  Marland Kitchen ACCU-CHEK FASTCLIX LANCETS MISC Check sugar 10 x daily. Lancets come in boxes of 102 ea.. Please dispense 9 boxes. (Patient not taking: Reported on 09/24/2019)  . BAYER  CONTOUR NEXT TEST test strip CHECK BLOOD SUGAR 10 X DAILY AS DIRECTED. PT IS ON INSULIN PUMP. (Patient not taking: Reported on 09/24/2019)  . Glucagon (BAQSIMI TWO PACK) 3 MG/DOSE POWD Place 1 application into the nose as needed. Use as directed if unconscious, unable to take food po, or having a seizure due to hypoglycemia (Patient not taking: Reported on 09/24/2019)  . glucagon (GLUCAGON EMERGENCY) 1 MG injection USE FOR SEVERE HYPOGLYCEMIA, INJECT 1MG INTRAMUSCULARLY IF UNRESPONSIVE OR  UNCONCIOUS (Patient not taking: Reported on 09/24/2019)  . Insulin Infusion Pump (T:SLIM X2 INSULIN PMP BASAL IQ) DEVI   . Insulin Infusion Pump Supplies (T:SLIM X2 3ML CARTRIDGE) MISC CHANGE CARTRIDGE EVERY     48-72 HOURS. (Patient not taking: Reported on 09/24/2019)  . nitrofurantoin, macrocrystal-monohydrate, (MACROBID) 100 MG capsule Take 1 capsule (100 mg total) by mouth 2 (two) times daily. (Patient not taking: Reported on 09/24/2019)  . Urine Glucose-Ketones Test STRP Use to check urine in cases of hyperglycemia. Dispense 2 vials of 50 each (Patient not taking: Reported on 09/24/2019)   No facility-administered encounter medications on file as of 09/24/2019.    Allergies: No Known Allergies  Surgical History: No past surgical history on file.   Family History:  Family History  Problem Relation Age of Onset  . Diabetes Other        father's maternal grandmother had type 1 diabetes  . Hypertension Maternal Grandfather   . Thyroid disease Paternal Grandmother   . Hypertension Maternal Aunt   . Asthma Maternal Uncle   . Asthma Paternal Aunt     Social History: Attends [employer], going to school for nursing  Physical Exam:  Vitals:   09/24/19 0932  BP: 112/70  Pulse: 98  Weight: 119 lb 9.6 oz (54.3 kg)   BP 112/70   Pulse 98   Wt 119 lb 9.6 oz (54.3 kg)   BMI 18.68 kg/m  Body mass index: body mass index is 18.68 kg/m. Growth percentile SmartLinks can only be used for patients less than 80 years old.  Wt Readings from Last 3 Encounters:  09/24/19 119 lb 9.6 oz (54.3 kg)  04/24/19 129 lb 12.8 oz (58.9 kg)  10/11/18 132 lb (59.9 kg)   Ht Readings from Last 3 Encounters:  04/24/19 5' 7.09" (1.704 m)  10/11/18 _0  (1.727 m)  10/10/18 _1  (1.727 m)    General: Well developed, well nourished female in no acute distress.  Head: Normocephalic, atraumatic.   Eyes:  Pupils equal and round. EOMI.   Sclera white.  No eye drainage.   Ears/Nose/Mouth/Throat: Nares patent, no  nasal drainage.  Normal dentition, mucous membranes moist.   Neck: supple, no cervical lymphadenopathy, no thyromegaly Cardiovascular: regular rate, normal S1/S2, no murmurs Respiratory: No increased work of breathing.  Lungs clear to auscultation bilaterally.  No wheezes. Abdomen: soft, nontender, nondistended. Normal bowel sounds.  No appreciable masses  Extremities: warm, well perfused, cap refill < 2 sec.   Musculoskeletal: Normal muscle mass.  Normal strength Skin: warm, dry.  No rash or lesions. Neurologic: alert and oriented, normal speech, no tremor   Labs: Results for orders placed or performed in visit on 09/24/19  POCT glycosylated hemoglobin (Hb A1C)  Result Value Ref Range   Hemoglobin A1C 6.7 (A) 4.0 - 5.6 %   HbA1c POC (<> result, manual entry)     HbA1c, POC (prediabetic range)     HbA1c, POC (controlled diabetic range)    POCT Glucose (Device for Home Use)  Result Value Ref Range   Glucose Fasting, POC 152 (A) 70 - 99 mg/dL   POC Glucose      Assessment/Plan: JANELLI WELLING is a 24 y.o. female with well controlled T1DM on a pump and CGM regimen (tanmdem control IQ). Having blood sugars spikes post prandial at breakfast and lunch. Mild adjustment made to correction factor. Hemoglobin A1c is 6.7% today which meets the ADA goal of <7%. 10 lbs weight loss over past 6 months, unclear cause but recent illness is a contributing factor.    1. Type 1 diabetes mellitus without complications (HCC)/hypoglycemia/hyperglycemia  - Reviewed insulin pump and CGM download. Discussed trends and patterns.  - Rotate pump sites to prevent scar tissue.  - bolus 15 minutes prior to eating to limit blood sugar spikes.  - Reviewed carb counting and importance of accurate carb counting.  - Discussed signs and symptoms of hypoglycemia. Always have glucose available.  - POCT glucose and hemoglobin A1c  - Reviewed growth chart.  - Lab orders placed. Lipid panel, TFTs, microalbumin  2.  Insulin pump titration T-slim pump settings: Time Basal Rate Correction Factor Carb Ratio Target BG  12A 0.85 60 15 150  4A 0.95 60 15 150  6A 1.050 55 10 130  8A 1.1 60--> 55 10 130  12P 1.2 60--> 55  11 130  9P 1.35 60 11 130  10P 1.1 60 11 150  Total Daily Basal: 26.8 units/day  3. Weight loss.  - Discussed growth chart and weight loss.  - Encouraged good caloric intake.  - She reports having CBC checked recently that was "normal"  - Will monitor weight trend.    Follow-up: 3 months.   Medical decision-making:  >45 spent today reviewing the medical chart, counseling the patient/family, and documenting today's visit.   Hermenia Bers,  FNP-C  Pediatric Specialist  [address] [place]  [place], [address]  Tele: [phone]

## 2019-09-24 NOTE — Patient Instructions (Addendum)
T-slim pump settings: Time Basal Rate Correction Factor Carb Ratio Target BG  12A 0.85 60 15 150  4A 0.95 60 15 150  6A 1.050 55 10 130  8A 1.1 60--> 55 10 130  12P 1.2 60--> 55  11 130  9P 1.35 60 11 130  10P 1.1 60 11 150  Total Daily Basal: 26.8 units/day   A1c is 6.7%

## 2019-11-07 LAB — MICROALBUMIN / CREATININE URINE RATIO
Creatinine, Urine: 82 mg/dL (ref 20–275)
Microalb Creat Ratio: 5 mcg/mg creat (ref <30)
Microalb, Ur: 0.4 mg/dL

## 2019-11-07 LAB — COMPREHENSIVE METABOLIC PANEL
AG Ratio: 1.6 (calc) (ref 1.0–2.5)
ALT: 10 U/L (ref 6–29)
AST: 14 U/L (ref 10–30)
Albumin: 4.1 g/dL (ref 3.6–5.1)
Alkaline phosphatase (APISO): 52 U/L (ref 31–125)
BUN: 8 mg/dL (ref 7–25)
CO2: 26 mmol/L (ref 20–32)
Calcium: 8.7 mg/dL (ref 8.6–10.2)
Chloride: 102 mmol/L (ref 98–110)
Creat: 0.77 mg/dL (ref 0.50–1.10)
Globulin: 2.6 g/dL (calc) (ref 1.9–3.7)
Glucose, Bld: 107 mg/dL — ABNORMAL HIGH (ref 65–99)
Potassium: 4.1 mmol/L (ref 3.5–5.3)
Sodium: 137 mmol/L (ref 135–146)
Total Bilirubin: 0.9 mg/dL (ref 0.2–1.2)
Total Protein: 6.7 g/dL (ref 6.1–8.1)

## 2019-11-07 LAB — CBC WITH DIFFERENTIAL/PLATELET
Absolute Monocytes: 442 cells/uL (ref 200–950)
Basophils Absolute: 20 cells/uL (ref 0–200)
Basophils Relative: 0.3 %
Eosinophils Absolute: 107 cells/uL (ref 15–500)
Eosinophils Relative: 1.6 %
HCT: 38.2 % (ref 35.0–45.0)
Hemoglobin: 12.6 g/dL (ref 11.7–15.5)
Lymphs Abs: 1152 cells/uL (ref 850–3900)
MCH: 28.3 pg (ref 27.0–33.0)
MCHC: 33 g/dL (ref 32.0–36.0)
MCV: 85.8 fL (ref 80.0–100.0)
MPV: 11.1 fL (ref 7.5–12.5)
Monocytes Relative: 6.6 %
Neutro Abs: 4978 cells/uL (ref 1500–7800)
Neutrophils Relative %: 74.3 %
Platelets: 223 10*3/uL (ref 140–400)
RBC: 4.45 10*6/uL (ref 3.80–5.10)
RDW: 13.4 % (ref 11.0–15.0)
Total Lymphocyte: 17.2 %
WBC: 6.7 10*3/uL (ref 3.8–10.8)

## 2019-11-07 LAB — LIPID PANEL
Cholesterol: 168 mg/dL (ref <200)
HDL: 57 mg/dL (ref >=50)
LDL Cholesterol (Calc): 98 mg/dL (calc)
Non-HDL Cholesterol (Calc): 111 mg/dL (calc) (ref <130)
Total CHOL/HDL Ratio: 2.9 (calc) (ref <5.0)
Triglycerides: 51 mg/dL (ref <150)

## 2019-11-07 LAB — T4, FREE: Free T4: 1.1 ng/dL (ref 0.8–1.8)

## 2019-11-07 LAB — TSH: TSH: 1.22 mIU/L

## 2019-12-07 ENCOUNTER — Other Ambulatory Visit: Payer: Self-pay

## 2019-12-07 ENCOUNTER — Encounter (INDEPENDENT_AMBULATORY_CARE_PROVIDER_SITE_OTHER): Payer: Self-pay | Admitting: Family

## 2019-12-07 ENCOUNTER — Ambulatory Visit (INDEPENDENT_AMBULATORY_CARE_PROVIDER_SITE_OTHER): Payer: BC Managed Care – PPO | Admitting: Family

## 2019-12-07 VITALS — BP 116/62 | HR 92 | Wt 124.0 lb

## 2019-12-07 DIAGNOSIS — E11649 Type 2 diabetes mellitus with hypoglycemia without coma: Secondary | ICD-10-CM | POA: Diagnosis not present

## 2019-12-07 DIAGNOSIS — Z4681 Encounter for fitting and adjustment of insulin pump: Secondary | ICD-10-CM

## 2019-12-07 DIAGNOSIS — R739 Hyperglycemia, unspecified: Secondary | ICD-10-CM | POA: Diagnosis not present

## 2019-12-07 DIAGNOSIS — E109 Type 1 diabetes mellitus without complications: Secondary | ICD-10-CM | POA: Diagnosis not present

## 2019-12-07 DIAGNOSIS — R002 Palpitations: Secondary | ICD-10-CM

## 2019-12-07 LAB — POCT GLYCOSYLATED HEMOGLOBIN (HGB A1C): Hemoglobin A1C: 6.3 % — AB (ref 4.0–5.6)

## 2019-12-07 LAB — POCT GLUCOSE (DEVICE FOR HOME USE): POC Glucose: 189 mg/dl — AB (ref 70–99)

## 2019-12-07 NOTE — Patient Instructions (Signed)
-  Always have fast sugar with you in case of low blood sugar (glucose tabs, regular juice or soda, candy) -Always wear your ID that states you have diabetes -Always bring your meter to your visit -Call/Email if you want to review blood sugars  Please sign up for MyChart. This is a communication tool that allows you to send an email directly to me. This can be used for questions, prescriptions and blood sugar reports. We will also release labs to you with instructions on MyChart. Please do not use MyChart if you need immediate or emergency assistance. Ask our wonderful front office staff if you need assistance.   

## 2019-12-07 NOTE — Progress Notes (Signed)
Pediatric Endocrinology Consultation Follow-up Visit  SHWETA AMAN 07-18-1995 [id number]   Chief Complaint: Follow-up of Type 1 diabetes  HPI: Katelyn Lutz  is a 24 y.o. female presenting for follow-up of the above concerns.  she attended this visit alone.  [place] was diagnosed with new onset type 1 diabetes on 10/12/12. She had completed a course of antibiotics for a facial laceration and had developed oral thrush. She was seen at Urgent Care where they questioned why a previously healthy young adult would have thrush. They obtained a finger stick BG which was too high to read on the POC BG meter. They then obtained serum labs and urine which were consistent with new onset diabetes. She was then sent to [facility] where she was diagnosed with T1DM and started on insulin.  [place] was last seen at [facility] on 09/2019.  Since last visit, she has been well.  She is currently working a [employer] and will be going back to [employer] in a few days. She is using Tslim insulin and Control IQ which is working well for her. She recently had a UTI and is on bactrim which has been causing some lows. She has began to experienced mild palpitations "a few days ago" which she is contacting her PCP about to see if it could be due to antibiotic. Reports it lasted a few seconds and went away. She has a grandmother that has A-fib.    Insulin regimen:  Time Basal Rate Correction Factor Carb Ratio Target BG  12A 0.85 60 15 150  4A 0.95 60 15 150  6A 1.050 55 10 130  8A 1.1  55 10 130  12P 1.2  55  11 130  9P 1.35 60 11 130  10P 1.1 60 11 150  Total Daily Basal: 26.8 units/day  CGM download: Pump is setting for low alarm under 100. Time under 80 is much less frequent. Having blood sugar spike between 8am-2pm.    Hypoglycemia: can feel low blood sugars.  No glucagon needed recently. Needs new rx for glucagon Wearing Med-alert ID currently: Yes  Injection sites: abdominal wall, buttock(s) and thigh(s) Annual labs  due: 10/2019 Ophthalmology due: 04/19/2019- no problems  ROS:  All systems reviewed with pertinent positives listed below; otherwise negative. Constitutional: Sleeping well. 5 lbs weight gain  HEENT: No neck pain. No difficulty swallowing.  Respiratory: No increased work of breathing currently Cardiac: no tachycardia. + occasional palpitations (contacting PCP)  GI: No constipation or diarrhea Musculoskeletal: No joint deformity Neuro: Normal affect.No tremor.  Endocrine: As above  Past Medical History:   Past Medical History:  Diagnosis Date  . Diabetes mellitus without complication ([place])   . New onset type 1 diabetes mellitus, uncontrolled ([place]) 10/25/2012  . Pigmentation abnormality of skin    no pigment in eyelids or genital area    Meds: Outpatient Encounter Medications as of 12/07/2019  Medication Sig  . ACCU-CHEK FASTCLIX LANCETS MISC Check sugar 10 x daily. Lancets come in boxes of 102 ea.. Please dispense 9 boxes.  Marland Kitchen BAYER CONTOUR NEXT TEST test strip CHECK BLOOD SUGAR 10 X DAILY AS DIRECTED. PT IS ON INSULIN PUMP.  . Continuous Blood Gluc Sensor (DEXCOM G6 SENSOR) MISC Inject 1 kit into the skin daily as needed.  . Continuous Blood Gluc Transmit (DEXCOM G6 TRANSMITTER) MISC 1 kit.  . Glucagon (BAQSIMI TWO PACK) 3 MG/DOSE POWD Place 1 application into the nose as needed. Use as directed if unconscious, unable to take food po, or  having a seizure due to hypoglycemia  . insulin aspart (NOVOLOG) 100 UNIT/ML injection USE 300 UNITS IN INSULIN PUMP EVERY 48 HOURS  . Insulin Infusion Pump (T:SLIM X2 INSULIN PMP BASAL IQ) DEVI   . Insulin Infusion Pump Supplies (AUTOSOFT XC INFUSION SET) MISC CHANGE INFUSION SET EVERY  48-72 HOURS  . Insulin Infusion Pump Supplies (T:SLIM X2 3ML CARTRIDGE) MISC CHANGE CARTRIDGE EVERY     48-72 HOURS.  Marland Kitchen Norethin Ace-Eth Estrad-FE 1-20 MG-MCG(24) CAPS Take 1 tablet by mouth daily.  . Urine Glucose-Ketones Test STRP Use to check urine in cases of  hyperglycemia. Dispense 2 vials of 50 each  . glucagon (GLUCAGON EMERGENCY) 1 MG injection USE FOR SEVERE HYPOGLYCEMIA, INJECT 1MG INTRAMUSCULARLY IF UNRESPONSIVE OR UNCONCIOUS (Patient not taking: Reported on 09/24/2019)  . nitrofurantoin, macrocrystal-monohydrate, (MACROBID) 100 MG capsule Take 1 capsule (100 mg total) by mouth 2 (two) times daily. (Patient not taking: Reported on 09/24/2019)   No facility-administered encounter medications on file as of 12/07/2019.    Allergies: No Known Allergies  Surgical History: No past surgical history on file.   Family History:  Family History  Problem Relation Age of Onset  . Diabetes Other        father's maternal grandmother had type 1 diabetes  . Hypertension Maternal Grandfather   . Thyroid disease Paternal Grandmother   . Hypertension Maternal Aunt   . Asthma Maternal Uncle   . 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 Growth percentile SmartLinks can only be used for patients less than 20 years old.  Wt Readings from Last 3 Encounters:  12/07/19 124 lb (56.2 kg)  09/24/19 119 lb 9.6 oz (54.3 kg)  04/24/19 129 lb 12.8 oz (58.9 kg)   Ht Readings from Last 3 Encounters:  04/24/19 5' 7.09" (1.704 m)  10/11/18 5' 8"  (1.727 m)  10/10/18 5' 8"  (1.727 m)    General: Well developed, well nourished female in no acute distress.  Head: Normocephalic, atraumatic.   Eyes:  Pupils equal and round. EOMI.   Sclera white.  No eye drainage.   Ears/Nose/Mouth/Throat: Nares patent, no nasal drainage.  Normal dentition, mucous membranes moist.   Neck: supple, no cervical lymphadenopathy, no thyromegaly Cardiovascular: regular rate, normal S1/S2, no  murmurs Respiratory: No increased work of breathing.  Lungs clear to auscultation bilaterally.  No wheezes. Abdomen: soft, nontender, nondistended. Normal bowel sounds.  No appreciable masses  Extremities: warm, well perfused, cap refill < 2 sec.   Musculoskeletal: Normal muscle mass.  Normal strength Skin: warm, dry.  No rash or lesions. Neurologic: alert and oriented, normal speech, no tremor    Labs: Results for orders placed or performed in visit on 09/24/19  T4, free  Result Value Ref Range   Free T4 1.1 0.8 - 1.8 ng/dL  TSH  Result Value Ref Range   TSH 1.22 mIU/L  CBC with Differential/Platelet  Result Value Ref Range   WBC 6.7 3.8 - 10.8 Thousand/uL   RBC 4.45 3.80 - 5.10 Million/uL   Hemoglobin 12.6 11.7 - 15.5 g/dL   HCT 38.2 35 - 45 %   MCV  85.8 80.0 - 100.0 fL   MCH 28.3 27.0 - 33.0 pg   MCHC 33.0 32.0 - 36.0 g/dL   RDW 13.4 11.0 - 15.0 %   Platelets 223 140 - 400 Thousand/uL   MPV 11.1 7.5 - 12.5 fL   Neutro Abs 4,978 1,500 - 7,800 cells/uL   Lymphs Abs 1,152 850 - 3,900 cells/uL   Absolute Monocytes 442 200 - 950 cells/uL   Eosinophils Absolute 107 15 - 500 cells/uL   Basophils Absolute 20 0 - 200 cells/uL   Neutrophils Relative % 74.3 %   Total Lymphocyte 17.2 %   Monocytes Relative 6.6 %   Eosinophils Relative 1.6 %   Basophils Relative 0.3 %  Comprehensive metabolic panel  Result Value Ref Range   Glucose, Bld 107 (H) 65 - 99 mg/dL   BUN 8 7 - 25 mg/dL   Creat 0.77 0.50 - 1.10 mg/dL   BUN/Creatinine Ratio NOT APPLICABLE 6 - 22 (calc)   Sodium 137 135 - 146 mmol/L   Potassium 4.1 3.5 - 5.3 mmol/L   Chloride 102 98 - 110 mmol/L   CO2 26 20 - 32 mmol/L   Calcium 8.7 8.6 - 10.2 mg/dL   Total Protein 6.7 6.1 - 8.1 g/dL   Albumin 4.1 3.6 - 5.1 g/dL   Globulin 2.6 1.9 - 3.7 g/dL (calc)   AG Ratio 1.6 1.0 - 2.5 (calc)   Total Bilirubin 0.9 0.2 - 1.2 mg/dL   Alkaline phosphatase (APISO) 52 31 - 125 U/L   AST 14 10 - 30 U/L   ALT 10 6 - 29 U/L  Lipid  panel  Result Value Ref Range   Cholesterol 168 <200 mg/dL   HDL 57 > OR = 50 mg/dL   Triglycerides 51 <150 mg/dL   LDL Cholesterol (Calc) 98 mg/dL (calc)   Total CHOL/HDL Ratio 2.9 <5.0 (calc)   Non-HDL Cholesterol (Calc) 111 <130 mg/dL (calc)  Microalbumin / creatinine urine ratio  Result Value Ref Range   Creatinine, Urine 82 20 - 275 mg/dL   Microalb, Ur 0.4 mg/dL   Microalb Creat Ratio 5 <30 mcg/mg creat  POCT glycosylated hemoglobin (Hb A1C)  Result Value Ref Range   Hemoglobin A1C 6.7 (A) 4.0 - 5.6 %   HbA1c POC (<> result, manual entry)     HbA1c, POC (prediabetic range)     HbA1c, POC (controlled diabetic range)    POCT Glucose (Device for Home Use)  Result Value Ref Range   Glucose Fasting, POC 152 (A) 70 - 99 mg/dL   POC Glucose      Assessment/Plan: VALITA RIGHTER is a 24 y.o. female with well controlled T1DM on a pump and CGM regimen (tanmdem control IQ). She is doing well with diabetes control. Hemoglobin A1c is 6.3% today which meets the ADA goal of <7%.   1. Type 1 diabetes mellitus without complications (HCC)/hypoglycemia/hyperglycemia  - Reviewed insulin pump and CGM download. Discussed trends and patterns.  - Rotate pump sites to prevent scar tissue.  - bolus 15 minutes prior to eating to limit blood sugar spikes.  - Reviewed carb counting and importance of accurate carb counting.  - Discussed signs and symptoms of hypoglycemia. Always have glucose available.  - POCT glucose and hemoglobin A1c  - Reviewed growth chart.   2. Insulin pump titration No changes today   3. Palpitation - Not due to thyroid as her antibodies are negative and TSH, FT4 on 11/2019 were normal.  - Advised to  limit activity until after evaluation by PCP  - Encouraged close follow up with PCP.    Follow-up: 3 months.   Medical decision-making:  >45 spent today reviewing the medical chart, counseling the patient/family, and documenting today's visit.    Hermenia Bers,   FNP-C  Pediatric Specialist  [address] [place]  [place], [address]  Tele: [phone]

## 2020-02-07 ENCOUNTER — Other Ambulatory Visit (INDEPENDENT_AMBULATORY_CARE_PROVIDER_SITE_OTHER): Payer: Self-pay | Admitting: Family

## 2020-03-26 ENCOUNTER — Ambulatory Visit (INDEPENDENT_AMBULATORY_CARE_PROVIDER_SITE_OTHER): Payer: BC Managed Care – PPO | Admitting: Family

## 2020-03-26 ENCOUNTER — Other Ambulatory Visit: Payer: Self-pay

## 2020-03-26 ENCOUNTER — Encounter (INDEPENDENT_AMBULATORY_CARE_PROVIDER_SITE_OTHER): Payer: Self-pay | Admitting: Family

## 2020-03-26 VITALS — BP 98/60 | HR 98 | Wt 120.6 lb

## 2020-03-26 DIAGNOSIS — E109 Type 1 diabetes mellitus without complications: Secondary | ICD-10-CM

## 2020-03-26 DIAGNOSIS — Z9641 Presence of insulin pump (external) (internal): Secondary | ICD-10-CM | POA: Diagnosis not present

## 2020-03-26 DIAGNOSIS — E11649 Type 2 diabetes mellitus with hypoglycemia without coma: Secondary | ICD-10-CM | POA: Diagnosis not present

## 2020-03-26 DIAGNOSIS — E1065 Type 1 diabetes mellitus with hyperglycemia: Secondary | ICD-10-CM | POA: Diagnosis not present

## 2020-03-26 DIAGNOSIS — R739 Hyperglycemia, unspecified: Secondary | ICD-10-CM

## 2020-03-26 LAB — POCT GLUCOSE (DEVICE FOR HOME USE): POC Glucose: 173 mg/dl — AB (ref 70–99)

## 2020-03-26 LAB — POCT GLYCOSYLATED HEMOGLOBIN (HGB A1C): Hemoglobin A1C: 6.5 % — AB (ref 4.0–5.6)

## 2020-03-26 NOTE — Patient Instructions (Signed)

## 2020-03-26 NOTE — Progress Notes (Signed)
Pediatric Endocrinology Consultation Follow-up Visit  Katelyn Lutz Dec 16, 1995 [id number]   Chief Complaint: Follow-up of Type 1 diabetes  HPI: Katelyn Lutz  is a 24 y.o. female presenting for follow-up of the above concerns.  she attended this visit alone.  [place] was diagnosed with new onset type 1 diabetes on 10/12/12. She had completed a course of antibiotics for a facial laceration and had developed oral thrush. She was seen at Urgent Care where they questioned why a previously healthy young adult would have thrush. They obtained a finger stick BG which was too high to read on the POC BG meter. They then obtained serum labs and urine which were consistent with new onset diabetes. She was then sent to [facility] where she was diagnosed with T1DM and started on insulin.  [place] was last seen at [facility] on 12/2019.  Since last visit, she has been well.  She is doing well in nursing school, she has two semesters left. She is working at [employer] in [employer] in her free time.   She is wearing Tslim insulin pump and Dexcom CGM. Both are working well for her. She feels like blood sugars have been pretty good overall, hypoglycemia is rare. Usually hyperglycemia is due to the type food she eats.   She is no longer having palpitations. She reports they stopped after she finished her antibiotics.   She is currently working a [employer] and will be going back to [employer] [employer] [facility] in a few days. She is using Tslim insulin and Control IQ which is working well for her. She recently had a UTI and is on bactrim which has been causing some lows. She has began to experienced mild palpitations "a few days ago" which she is contacting her PCP about to see if it could be due to antibiotic. Reports it lasted a few seconds and went away. She has a grandmother that has A-fib.    Insulin regimen:  Time Basal Rate Correction Factor Carb Ratio Target BG  12A 0.85 60 15 150  4A 0.95 60 15 150  6A 1.050 55 10 130  8A 1.1  55  10 130  12P 1.2  55  11 130  9P 1.35 60 11 130  10P 1.1 60 11 150  Total Daily Basal: 26.8 units/day  CGM download: Pump is setting for low alarm under 100. Time under 80 is much less frequent. Having blood sugar spike between 8am-2pm.    Hypoglycemia: can feel low blood sugars.  No glucagon needed recently. Needs new rx for glucagon Wearing Med-alert ID currently: Yes  Injection sites: abdominal wall, buttock(s) and thigh(s) Annual labs due: 10/2019 Ophthalmology due: 04/19/2019- no problems. Due   ROS:  All systems reviewed with pertinent positives listed below; otherwise negative. Constitutional: Sleeping well. 5 lbs weight gain  HEENT: No neck pain. No difficulty swallowing.  Respiratory: No increased work of breathing currently Cardiac: no tachycardia. + occasional palpitations (contacting PCP)  GI: No constipation or diarrhea Musculoskeletal: No joint deformity Neuro: Normal affect.No tremor.  Endocrine: As above  Past Medical History:   Past Medical History:  Diagnosis Date  . Diabetes mellitus without complication ([place])   . New onset type 1 diabetes mellitus, uncontrolled ([place]) 10/25/2012  . Pigmentation abnormality of skin    no pigment in eyelids or genital area    Meds: Outpatient Encounter Medications as of 03/26/2020  Medication Sig  . Clindamycin-Benzoyl Per, Refr, gel Apply 1 application topically daily.  . Continuous Blood Gluc Sensor (DEXCOM  G6 SENSOR) MISC Inject 1 kit into the skin daily as needed.  . Continuous Blood Gluc Transmit (DEXCOM G6 TRANSMITTER) MISC 1 kit.  Marland Kitchen insulin aspart (NOVOLOG) 100 UNIT/ML injection USE 300 UNITS IN INSULIN PUMP EVERY 48 HOURS  . Insulin Infusion Pump (T:SLIM X2 INSULIN PMP BASAL IQ) DEVI   . Insulin Infusion Pump Supplies (AUTOSOFT XC INFUSION SET) MISC CHANGE INFUSION SET EVERY  48-72 HOURS  . Insulin Infusion Pump Supplies (T:SLIM X2 3ML CARTRIDGE) MISC CHANGE CARTRIDGE EVERY     48-72 HOURS.  Marland Kitchen ACCU-CHEK FASTCLIX  LANCETS MISC Check sugar 10 x daily. Lancets come in boxes of 102 ea.. Please dispense 9 boxes. (Patient not taking: Reported on 03/26/2020)  . BAYER CONTOUR NEXT TEST test strip CHECK BLOOD SUGAR 10 X DAILY AS DIRECTED. PT IS ON INSULIN PUMP. (Patient not taking: Reported on 03/26/2020)  . Glucagon (BAQSIMI TWO PACK) 3 MG/DOSE POWD Place 1 application into the nose as needed. Use as directed if unconscious, unable to take food po, or having a seizure due to hypoglycemia (Patient not taking: Reported on 03/26/2020)  . glucagon (GLUCAGON EMERGENCY) 1 MG injection USE FOR SEVERE HYPOGLYCEMIA, INJECT 1MG INTRAMUSCULARLY IF UNRESPONSIVE OR UNCONCIOUS (Patient not taking: Reported on 09/24/2019)  . nitrofurantoin, macrocrystal-monohydrate, (MACROBID) 100 MG capsule Take 1 capsule (100 mg total) by mouth 2 (two) times daily. (Patient not taking: Reported on 09/24/2019)  . Norethin Ace-Eth Estrad-FE 1-20 MG-MCG(24) CAPS Take 1 tablet by mouth daily. (Patient not taking: Reported on 03/26/2020)  . tretinoin (RETIN-A) 0.025 % gel Apply 1 application topically daily. (Patient not taking: Reported on 03/26/2020)  . Urine Glucose-Ketones Test STRP Use to check urine in cases of hyperglycemia. Dispense 2 vials of 50 each (Patient not taking: Reported on 03/26/2020)   No facility-administered encounter medications on file as of 03/26/2020.    Allergies: No Known Allergies  Surgical History: No past surgical history on file.   Family History:  Family History  Problem Relation Age of Onset  . Diabetes Other        father's maternal grandmother had type 1 diabetes  . Hypertension Maternal Grandfather   . Thyroid disease Paternal Grandmother   . Hypertension Maternal Aunt   . Asthma Maternal Uncle   . Asthma Paternal Aunt     Social History: Attends [employer], going to school for nursing  Physical Exam:  Vitals:   03/26/20 1051  BP: 98/60  Pulse: 98  Weight: 120 lb 9.6 oz (54.7 kg)   BP 98/60   Pulse  98   Wt 120 lb 9.6 oz (54.7 kg)   BMI 18.84 kg/m  Body mass index: body mass index is 18.84 kg/m. Growth percentile SmartLinks can only be used for patients less than 16 years old.  Wt Readings from Last 3 Encounters:  03/26/20 120 lb 9.6 oz (54.7 kg)  12/07/19 124 lb (56.2 kg)  09/24/19 119 lb 9.6 oz (54.3 kg)   Ht Readings from Last 3 Encounters:  04/24/19 5' 7.09" (1.704 m)  10/11/18 5' 8"  (1.727 m)  10/10/18 5' 8"  (1.727 m)    General: Well developed, well nourished female in no acute distress.  Head: Normocephalic, atraumatic.   Eyes:  Pupils equal and round. EOMI.   Sclera white.  No eye drainage.   Ears/Nose/Mouth/Throat: Nares patent, no nasal drainage.  Normal dentition, mucous membranes moist.   Neck: supple, no cervical lymphadenopathy, no thyromegaly Cardiovascular: regular rate, normal S1/S2, no murmurs Respiratory: No increased work of breathing.  Lungs clear to auscultation bilaterally.  No wheezes. Abdomen: soft, nontender, nondistended. Normal bowel sounds.  No appreciable masses  Extremities: warm, well perfused, cap refill < 2 sec.   Musculoskeletal: Normal muscle mass.  Normal strength Skin: warm, dry.  No rash or lesions. Neurologic: alert and oriented, normal speech, no tremor   Labs: Results for orders placed or performed in visit on 03/26/20  POCT glycosylated hemoglobin (Hb A1C)  Result Value Ref Range   Hemoglobin A1C 6.5 (A) 4.0 - 5.6 %   HbA1c POC (<> result, manual entry)     HbA1c, POC (prediabetic range)     HbA1c, POC (controlled diabetic range)    POCT Glucose (Device for Home Use)  Result Value Ref Range   Glucose Fasting, POC     POC Glucose 173 (A) 70 - 99 mg/dl    Assessment/Plan: Katelyn Lutz is a 24 y.o. female with well controlled T1DM on a pump and CGM regimen (tanmdem control IQ). Diabetes control is excellent with closed loop insulin pump therapy. Hemoglobin A1c is stable at 6.5% today which meets the ADA goal of <7%.   .   1. Type 1 diabetes mellitus without complications (HCC)/hypoglycemia/hyperglycemia  - Reviewed insulin pump and CGM download. Discussed trends and patterns.  - Rotate pump sites to prevent scar tissue.  - bolus 15 minutes prior to eating to limit blood sugar spikes.  - Reviewed carb counting and importance of accurate carb counting.  - Discussed signs and symptoms of hypoglycemia. Always have glucose available.  - POCT glucose and hemoglobin A1c  - Reviewed growth chart.    2. Insulin pump titration No changes today. Pump in place.     3. Palpitation - Resolved.   Follow-up: 3 months.   Medical decision-making:  >45spent today reviewing the medical chart, counseling the patient/family, and documenting today's visit.     Hermenia Bers,  FNP-C  Pediatric Specialist  [address] [place]  [place], [address]  Tele: [phone]

## 2020-07-09 ENCOUNTER — Encounter (INDEPENDENT_AMBULATORY_CARE_PROVIDER_SITE_OTHER): Payer: Self-pay | Admitting: Family

## 2020-07-09 ENCOUNTER — Other Ambulatory Visit: Payer: Self-pay

## 2020-07-09 ENCOUNTER — Ambulatory Visit (INDEPENDENT_AMBULATORY_CARE_PROVIDER_SITE_OTHER): Payer: BC Managed Care – PPO | Admitting: Family

## 2020-07-09 VITALS — BP 98/64 | HR 76 | Wt 121.0 lb

## 2020-07-09 DIAGNOSIS — E1065 Type 1 diabetes mellitus with hyperglycemia: Secondary | ICD-10-CM | POA: Diagnosis not present

## 2020-07-09 DIAGNOSIS — Z9641 Presence of insulin pump (external) (internal): Secondary | ICD-10-CM

## 2020-07-09 DIAGNOSIS — E11649 Type 2 diabetes mellitus with hypoglycemia without coma: Secondary | ICD-10-CM | POA: Diagnosis not present

## 2020-07-09 DIAGNOSIS — R739 Hyperglycemia, unspecified: Secondary | ICD-10-CM

## 2020-07-09 DIAGNOSIS — E109 Type 1 diabetes mellitus without complications: Secondary | ICD-10-CM

## 2020-07-09 LAB — POCT GLYCOSYLATED HEMOGLOBIN (HGB A1C): Hemoglobin A1C: 6 % — AB (ref 4.0–5.6)

## 2020-07-09 LAB — POCT GLUCOSE (DEVICE FOR HOME USE): POC Glucose: 168 mg/dl — AB (ref 70–99)

## 2020-07-09 NOTE — Progress Notes (Signed)
Pediatric Endocrinology Consultation Follow-up Visit  Katelyn Lutz [birth year] [id number]   Chief Complaint: Follow-up of Type 1 diabetes  HPI: Katelyn Lutz  is a 25 y.o. female presenting for follow-up of the above concerns.  she attended this visit alone.  Katelyn Lutz was diagnosed with new onset type 1 diabetes on 10/12/12. She had completed a course of antibiotics for a facial laceration and had developed oral thrush. She was seen at Urgent Care where they questioned why a previously healthy young adult would have thrush. They obtained a finger stick BG which was too high to read on the POC BG meter. They then obtained serum labs and urine which were consistent with new onset diabetes. She was then sent to [facility] where she was diagnosed with T1DM and started on insulin.  Katelyn Lutz was last seen at [facility] on 03/2020.  Since last visit, she has been well.  Tslim insulin pump with control IQ and Dexcom CGM. She has done better bolusing before eating and feels like blood sugar is not spiking as much. Hypoglycemia is rare, she starts to feel low around 70.   Concerns:  - none.    Insulin regimen:  Time Basal Rate Correction Factor Carb Ratio Target BG  12A 0.85 60 15 150  4A 0.95 60 15 150  6A 1.050 55 10 130  8A 1.1  55 10 130  12P 1.2  55  11 130  9P 1.35 60 11 130  10P 1.1 60 11 150  Total Daily Basal: 26.8 units/day  CGM download: Pump is setting for low alarm under 100. Time under 80 is much less frequent. Having blood sugar spike between 8am-2pm.    Hypoglycemia: can feel low blood sugars.  No glucagon needed recently. Needs new rx for glucagon Wearing Med-alert ID currently: Yes  Injection sites: abdominal wall, buttock(s) and thigh(s) Annual labs due: 10/2020 Ophthalmology due: 04/19/2019- no problems. Due   ROS:  All systems reviewed with pertinent positives listed below; otherwise negative. Constitutional: Sleeping well. 1 lbs weight gain  HEENT: No neck pain. No  difficulty swallowing.  Respiratory: No increased work of breathing currently Cardiac: no tachycardia.  GI: No constipation or diarrhea Musculoskeletal: No joint deformity Neuro: Normal affect.No tremor.  Endocrine: As above  Past Medical History:   Past Medical History:  Diagnosis Date  . Diabetes mellitus without complication ([place])   . New onset type 1 diabetes mellitus, uncontrolled ([place]) 10/25/2012  . Pigmentation abnormality of skin    no pigment in eyelids or genital area    Meds: Outpatient Encounter Medications as of 07/09/2020  Medication Sig  . Continuous Blood Gluc Sensor (DEXCOM G6 SENSOR) MISC Inject 1 kit into the skin daily as needed.  . Continuous Blood Gluc Transmit (DEXCOM G6 TRANSMITTER) MISC 1 kit.  Marland Kitchen insulin aspart (NOVOLOG) 100 UNIT/ML injection USE 300 UNITS IN INSULIN PUMP EVERY 48 HOURS  . Insulin Infusion Pump (T:SLIM X2 INSULIN PMP BASAL IQ) DEVI   . Insulin Infusion Pump Supplies (AUTOSOFT XC INFUSION SET) MISC CHANGE INFUSION SET EVERY  48-72 HOURS  . ACCU-CHEK FASTCLIX LANCETS MISC Check sugar 10 x daily. Lancets come in boxes of 102 ea.. Please dispense 9 boxes. (Patient not taking: Reported on 03/26/2020)  . BAYER CONTOUR NEXT TEST test strip CHECK BLOOD SUGAR 10 X DAILY AS DIRECTED. PT IS ON INSULIN PUMP. (Patient not taking: Reported on 03/26/2020)  . Clindamycin-Benzoyl Per, Refr, gel Apply 1 application topically daily.  . Glucagon (BAQSIMI TWO PACK) 3  MG/DOSE POWD Place 1 application into the nose as needed. Use as directed if unconscious, unable to take food po, or having a seizure due to hypoglycemia (Patient not taking: Reported on 03/26/2020)  . glucagon (GLUCAGON EMERGENCY) 1 MG injection USE FOR SEVERE HYPOGLYCEMIA, INJECT 1MG INTRAMUSCULARLY IF UNRESPONSIVE OR UNCONCIOUS (Patient not taking: Reported on 09/24/2019)  . Insulin Infusion Pump Supplies (T:SLIM X2 3ML CARTRIDGE) MISC CHANGE CARTRIDGE EVERY     48-72 HOURS.  . nitrofurantoin,  macrocrystal-monohydrate, (MACROBID) 100 MG capsule Take 1 capsule (100 mg total) by mouth 2 (two) times daily. (Patient not taking: Reported on 09/24/2019)  . Norethin Ace-Eth Estrad-FE 1-20 MG-MCG(24) CAPS Take 1 tablet by mouth daily. (Patient not taking: Reported on 03/26/2020)  . tretinoin (RETIN-A) 0.025 % gel Apply 1 application topically daily. (Patient not taking: Reported on 03/26/2020)  . Urine Glucose-Ketones Test STRP Use to check urine in cases of hyperglycemia. Dispense 2 vials of 50 each (Patient not taking: Reported on 03/26/2020)   No facility-administered encounter medications on file as of 07/09/2020.    Allergies: No Known Allergies  Surgical History: No past surgical history on file.   Family History:  Family History  Problem Relation Age of Onset  . Diabetes Other        father's maternal grandmother had type 1 diabetes  . Hypertension Maternal Grandfather   . Thyroid disease Paternal Grandmother   . Hypertension Maternal Aunt   . Asthma Maternal Uncle   . Asthma Paternal Aunt     Social History: Attends [employer].   Physical Exam:  Vitals:   07/09/20 1445  BP: 98/64  Pulse: 76  Weight: 121 lb (54.9 kg)   BP 98/64   Pulse 76   Wt 121 lb (54.9 kg)   BMI 18.90 kg/m  Body mass index: body mass index is 18.9 kg/m. Growth percentile SmartLinks can only be used for patients less than 34 years old.  Wt Readings from Last 3 Encounters:  07/09/20 121 lb (54.9 kg)  03/26/20 120 lb 9.6 oz (54.7 kg)  12/07/19 124 lb (56.2 kg)   Ht Readings from Last 3 Encounters:  04/24/19 5' 7.09" (1.704 m)  10/11/18 _0  (1.727 m)  10/10/18 _1  (1.727 m)    General: Well developed, well nourished female in no acute distress.   Head: Normocephalic, atraumatic.   Eyes:  Pupils equal and round. EOMI.   Sclera white.  No eye drainage.   Ears/Nose/Mouth/Throat: Nares patent, no nasal drainage.  Normal dentition, mucous membranes moist.   Neck: supple,  no cervical lymphadenopathy, no thyromegaly Cardiovascular: regular rate, normal S1/S2, no murmurs Respiratory: No increased work of breathing.  Lungs clear to auscultation bilaterally.  No wheezes. Abdomen: soft, nontender, nondistended. Normal bowel sounds.  No appreciable masses  Extremities: warm, well perfused, cap refill < 2 sec.   Musculoskeletal: Normal muscle mass.  Normal strength Skin: warm, dry.  No rash or lesions. Neurologic: alert and oriented, normal speech, no tremor    Labs: Results for orders placed or performed in visit on 07/09/20  POCT glycosylated hemoglobin (Hb A1C)  Result Value Ref Range   Hemoglobin A1C 6.0 (A) 4.0 - 5.6 %   HbA1c POC (<> result, manual entry)     HbA1c, POC (prediabetic range)     HbA1c, POC (controlled diabetic range)    POCT Glucose (Device for Home Use)  Result Value Ref Range   Glucose Fasting, POC     POC Glucose 168 (  A) 70 - 99 mg/dl    Assessment/Plan: CATHERENE KALETA is a 25 y.o. female with well controlled T1DM on a pump and CGM regimen (tanmdem control IQ). She is doing very well with diabetes management. Hemoglobin A1c is 6% today.    1. Type 1 diabetes mellitus without complications (HCC)/hypoglycemia/hyperglycemia  - Reviewed insulin pump and CGM download. Discussed trends and patterns.  - Rotate pump sites to prevent scar tissue.  - bolus 15 minutes prior to eating to limit blood sugar spikes.  - Reviewed carb counting and importance of accurate carb counting.  - Discussed signs and symptoms of hypoglycemia. Always have glucose available.  - POCT glucose and hemoglobin A1c  - Reviewed growth chart.  - Discussed upcoming diabetes technology including app for Tandem TSlim.   2. Insulin pump titration No changes today. Pump in place.    .   Follow-up: 3 months.   Medical decision-making:  >45 spent today reviewing the medical chart, counseling the patient/family, and documenting today's visit.     Hermenia Bers,  FNP-C  Pediatric Specialist  [address] [place]  [place], [address]  Tele: [phone]

## 2020-08-09 ENCOUNTER — Other Ambulatory Visit (INDEPENDENT_AMBULATORY_CARE_PROVIDER_SITE_OTHER): Payer: Self-pay | Admitting: Family

## 2020-10-10 ENCOUNTER — Encounter: Payer: Self-pay | Admitting: Family Medicine

## 2020-10-27 ENCOUNTER — Ambulatory Visit (INDEPENDENT_AMBULATORY_CARE_PROVIDER_SITE_OTHER): Payer: BC Managed Care – PPO | Admitting: Family

## 2020-10-27 ENCOUNTER — Encounter (INDEPENDENT_AMBULATORY_CARE_PROVIDER_SITE_OTHER): Payer: Self-pay | Admitting: Family

## 2020-10-27 ENCOUNTER — Other Ambulatory Visit: Payer: Self-pay

## 2020-10-27 VITALS — BP 112/80 | HR 70 | Wt 122.6 lb

## 2020-10-27 DIAGNOSIS — E1065 Type 1 diabetes mellitus with hyperglycemia: Secondary | ICD-10-CM | POA: Diagnosis not present

## 2020-10-27 DIAGNOSIS — E11649 Type 2 diabetes mellitus with hypoglycemia without coma: Secondary | ICD-10-CM

## 2020-10-27 DIAGNOSIS — E109 Type 1 diabetes mellitus without complications: Secondary | ICD-10-CM

## 2020-10-27 DIAGNOSIS — Z9641 Presence of insulin pump (external) (internal): Secondary | ICD-10-CM | POA: Diagnosis not present

## 2020-10-27 DIAGNOSIS — R739 Hyperglycemia, unspecified: Secondary | ICD-10-CM

## 2020-10-27 LAB — POCT GLUCOSE (DEVICE FOR HOME USE): POC Glucose: 103 mg/dl — AB (ref 70–99)

## 2020-10-27 LAB — POCT GLYCOSYLATED HEMOGLOBIN (HGB A1C): Hemoglobin A1C: 6.3 % — AB (ref 4.0–5.6)

## 2020-10-27 NOTE — Progress Notes (Signed)
Pediatric Endocrinology Consultation Follow-up Visit  [name] 1995/12/07 [id number]   Chief Complaint: Follow-up of Type 1 diabetes  HPI: Katelyn Lutz  is a 25 y.o. female presenting for follow-up of the above concerns.  she attended this visit alone.  Katelyn Lutz was diagnosed with new onset type 1 diabetes on 10/12/12. She had completed a course of antibiotics for a facial laceration and had developed oral thrush. She was seen at Urgent Care where they questioned why a previously healthy young adult would have thrush. They obtained a finger stick BG which was too high to read on the POC BG meter. They then obtained serum labs and urine which were consistent with new onset diabetes. She was then sent to [facility] where she was diagnosed with T1DM and started on insulin.  [facility] was last seen at [facility] on 07/2020.  Since last visit, she has been well.  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 She will be getting her wisdom teeth out soon as well.   Wearing Tslim insulin pump with Dexcom CGM. Feels like blood sugars have been well managed overall since last visit. Hypoglycemia has been pretty rare, only occur if she is very active at work. She usually boluses before eating. Eats between 50-80 grams of carbs per meal.   Has ophthalmology appointment tomorrow for annual eye check.   Insulin regimen:  Time Basal Rate Correction Factor Carb Ratio Target BG  12A 0.85 60 15 150  4A 0.95 60 15 150  6A 1.050 55 10 130  8A 1.1  55 10 130  12P 1.2  55  11 130  9P 1.35 60 11 130  10P 1.1 60 11 150  Total Daily Basal: 26.8 units/day  CGM download: Pump is setting for low alarm under 100. Time under 80 is much less frequent. Having blood sugar spike between 8am-2pm.   Hypoglycemia: can feel low blood sugars.  No glucagon needed recently. Needs new rx for glucagon Wearing Med-alert ID currently: Yes  Injection sites: abdominal wall, buttock(s) and  thigh(s) Annual labs due: 10/2020  Ophthalmology due: 2022- no problems. Due   ROS:  All systems reviewed with pertinent positives listed below; otherwise negative. Constitutional: Sleeping well. Weight stable.  HEENT: No neck pain. No difficulty swallowing.  Respiratory: No increased work of breathing currently Cardiac: no tachycardia.  GI: No constipation or diarrhea Musculoskeletal: No joint deformity Neuro: Normal affect.No tremor.  Endocrine: As above  Past Medical History:   Past Medical History:  Diagnosis Date   Diabetes mellitus without complication ([place])    New onset type 1 diabetes mellitus, uncontrolled ([place]) 10/25/2012   Pigmentation abnormality of skin    no pigment in eyelids or genital area    Meds: Outpatient Encounter Medications as of 10/27/2020  Medication Sig   Clindamycin-Benzoyl Per, Refr, gel Apply 1 application topically daily.   Continuous Blood Gluc Sensor (DEXCOM G6 SENSOR) MISC Inject 1 kit into the skin daily as needed.   Continuous Blood Gluc Transmit (DEXCOM G6 TRANSMITTER) MISC 1 kit.   insulin aspart (NOVOLOG) 100 UNIT/ML injection USE 300 UNITS IN INSULIN PUMP EVERY 48 HOURS   Insulin Infusion Pump (T:SLIM X2 INSULIN PMP BASAL IQ) DEVI    Insulin Infusion Pump Supplies (AUTOSOFT XC INFUSION SET) MISC CHANGE INFUSION SET EVERY  48-72 HOURS   Insulin Infusion Pump Supplies (T:SLIM X2 3ML CARTRIDGE) MISC CHANGE CARTRIDGE EVERY     48-72 HOURS.  Norethin Ace-Eth Estrad-FE 1-20 MG-MCG(24) CAPS Take 1 tablet by mouth daily.   ACCU-CHEK FASTCLIX LANCETS MISC Check sugar 10 x daily. Lancets come in boxes of 102 ea.. Please dispense 9 boxes. (Patient not taking: Reported on 03/26/2020)   BAYER CONTOUR NEXT TEST test strip CHECK BLOOD SUGAR 10 X DAILY AS DIRECTED. PT IS ON INSULIN PUMP. (Patient not taking: Reported on 03/26/2020)   Glucagon (BAQSIMI TWO PACK) 3 MG/DOSE POWD Place 1 application into the nose as needed. Use as directed if unconscious,  unable to take food po, or having a seizure due to hypoglycemia (Patient not taking: Reported on 03/26/2020)   glucagon (GLUCAGON EMERGENCY) 1 MG injection USE FOR SEVERE HYPOGLYCEMIA, INJECT 1MG INTRAMUSCULARLY IF UNRESPONSIVE OR UNCONCIOUS (Patient not taking: Reported on 09/24/2019)   nitrofurantoin, macrocrystal-monohydrate, (MACROBID) 100 MG capsule Take 1 capsule (100 mg total) by mouth 2 (two) times daily. (Patient not taking: No sig reported)   tretinoin (RETIN-A) 0.025 % gel Apply 1 application topically daily. (Patient not taking: Reported on 03/26/2020)   Urine Glucose-Ketones Test STRP Use to check urine in cases of hyperglycemia. Dispense 2 vials of 50 each (Patient not taking: Reported on 03/26/2020)   No facility-administered encounter medications on file as of 10/27/2020.    Allergies: No Known Allergies  Surgical History: History reviewed. No pertinent surgical history.   Family History:  Family History  Problem Relation Age of Onset   Diabetes Other        father's maternal grandmother had type 1 diabetes   Hypertension Maternal Grandfather    Thyroid disease Paternal Grandmother    Hypertension Maternal Aunt    Asthma Maternal Uncle    Asthma Paternal Aunt     Social History: Attends [employer].   Physical Exam:  Vitals:   10/27/20 1334  BP: 112/80  Pulse: 70  Weight: 122 lb 9.6 oz (55.6 kg)    BP 112/80 (BP Location: Left Arm, Patient Position: Sitting, Cuff Size: Normal)   Pulse 70   Wt 122 lb 9.6 oz (55.6 kg)   BMI 19.15 kg/m  Body mass index: body mass index is 19.15 kg/m. Growth percentile SmartLinks can only be used for patients less than 42 years old.  Wt Readings from Last 3 Encounters:  10/27/20 122 lb 9.6 oz (55.6 kg)  07/09/20 121 lb (54.9 kg)  03/26/20 120 lb 9.6 oz (54.7 kg)   Ht Readings from Last 3 Encounters:  04/24/19 5' 7.09" (1.704 m)  10/11/18 5' 8"  (1.727 m)  10/10/18 5' 8"  (1.727 m)   General: Well developed,  well nourished female in no acute distress.   Head: Normocephalic, atraumatic.   Eyes:  Pupils equal and round. EOMI.   Sclera white.  No eye drainage.   Ears/Nose/Mouth/Throat: Nares patent, no nasal drainage.  Normal dentition, mucous membranes moist.   Neck: supple, no cervical lymphadenopathy, no thyromegaly Cardiovascular: regular rate, normal S1/S2, no murmurs Respiratory: No increased work of breathing.  Lungs clear to auscultation bilaterally.  No wheezes. Abdomen: soft, nontender, nondistended. Normal bowel sounds.  No appreciable masses  Extremities: warm, well perfused, cap refill < 2 sec.   Musculoskeletal: Normal muscle mass.  Normal strength Skin: warm, dry.  No rash or lesions. Neurologic: alert and oriented, normal speech, no tremor   Labs: Results for orders placed or performed in visit on 10/27/20  POCT Glucose (Device for Home Use)  Result Value Ref Range   Glucose Fasting, POC     POC Glucose  103 (A) 70 - 99 mg/dl  POCT glycosylated hemoglobin (Hb A1C)  Result Value Ref Range   Hemoglobin A1C 6.3 (A) 4.0 - 5.6 %   HbA1c POC (<> result, manual entry)     HbA1c, POC (prediabetic range)     HbA1c, POC (controlled diabetic range)      Assessment/Plan: SIMRAN BOMKAMP is a 25 y.o. female with well controlled T1DM on a pump and CGM regimen (tanmdem control IQ). Excellent control with closed loop insulin pump. Hemoglobin A1c is 6.3% which meets ADA goal of <7%.    1. Type 1 diabetes mellitus without complications (HCC)/hypoglycemia/hyperglycemia  - Reviewed insulin pump and CGM download. Discussed trends and patterns.  - Rotate pump sites to prevent scar tissue.  - bolus 15 minutes prior to eating to limit blood sugar spikes.  - Reviewed carb counting and importance of accurate carb counting.  - Discussed signs and symptoms of hypoglycemia. Always have glucose available.  - POCT glucose and hemoglobin A1c  - Reviewed growth chart.  - LIpid panel, TFTs and  microalbumin ordered.   2. Insulin pump titration No changes today. Pump in place.    .   Follow-up: 3 months.   Medical decision-making:  >45 spent today reviewing the medical chart, counseling the patient/family, and documenting today's visit.      Hermenia Bers,  FNP-C  Pediatric Specialist  [address] [place]  [place], [address]  Tele: [phone]

## 2020-10-28 LAB — HM DIABETES EYE EXAM

## 2020-11-11 ENCOUNTER — Telehealth (INDEPENDENT_AMBULATORY_CARE_PROVIDER_SITE_OTHER): Payer: Self-pay

## 2020-11-11 NOTE — Telephone Encounter (Signed)
Called patient in regards to new insurance and needing new rx. LVM with call back number.

## 2020-11-12 ENCOUNTER — Other Ambulatory Visit (INDEPENDENT_AMBULATORY_CARE_PROVIDER_SITE_OTHER): Payer: Self-pay

## 2020-11-12 DIAGNOSIS — E109 Type 1 diabetes mellitus without complications: Secondary | ICD-10-CM

## 2020-11-12 MED ORDER — DEXCOM G6 SENSOR MISC: 1 refills | Status: DC

## 2020-11-14 ENCOUNTER — Other Ambulatory Visit (INDEPENDENT_AMBULATORY_CARE_PROVIDER_SITE_OTHER): Payer: Self-pay

## 2020-11-14 MED ORDER — DEXCOM G6 TRANSMITTER MISC: 4 refills | Status: DC

## 2020-11-21 ENCOUNTER — Other Ambulatory Visit (INDEPENDENT_AMBULATORY_CARE_PROVIDER_SITE_OTHER): Payer: Self-pay | Admitting: Family

## 2020-12-23 LAB — LIPID PANEL
Chol/HDL Ratio: 3.5 ratio (ref 0.0–4.4)
Cholesterol, Total: 166 mg/dL (ref 100–199)
HDL: 48 mg/dL (ref >39)
LDL Chol Calc (NIH): 109 mg/dL — ABNORMAL HIGH (ref 0–99)
Triglycerides: 45 mg/dL (ref 0–149)
VLDL Cholesterol Cal: 9 mg/dL (ref 5–40)

## 2020-12-23 LAB — T4, FREE: Free T4: 1.21 ng/dL (ref 0.82–1.77)

## 2020-12-23 LAB — TSH: TSH: 0.695 u[IU]/mL (ref 0.450–4.500)

## 2020-12-23 LAB — MICROALBUMIN / CREATININE URINE RATIO
Creatinine, Urine: 39.5 mg/dL
Microalb/Creat Ratio: <8 mg/g creat (ref 0–29)
Microalbumin, Urine: <3 ug/mL

## 2021-05-12 ENCOUNTER — Other Ambulatory Visit (INDEPENDENT_AMBULATORY_CARE_PROVIDER_SITE_OTHER): Payer: Self-pay | Admitting: Pediatrics

## 2021-05-12 DIAGNOSIS — E109 Type 1 diabetes mellitus without complications: Secondary | ICD-10-CM

## 2021-06-09 ENCOUNTER — Other Ambulatory Visit (INDEPENDENT_AMBULATORY_CARE_PROVIDER_SITE_OTHER): Payer: Self-pay | Admitting: Family

## 2021-08-20 ENCOUNTER — Ambulatory Visit (INDEPENDENT_AMBULATORY_CARE_PROVIDER_SITE_OTHER): Payer: BC Managed Care – PPO | Admitting: Family

## 2021-08-20 ENCOUNTER — Encounter (INDEPENDENT_AMBULATORY_CARE_PROVIDER_SITE_OTHER): Payer: Self-pay | Admitting: Family

## 2021-08-20 VITALS — BP 118/72 | HR 88 | Wt 125.4 lb

## 2021-08-20 DIAGNOSIS — Z4681 Encounter for fitting and adjustment of insulin pump: Secondary | ICD-10-CM

## 2021-08-20 DIAGNOSIS — E109 Type 1 diabetes mellitus without complications: Secondary | ICD-10-CM

## 2021-08-20 LAB — POCT GLUCOSE (DEVICE FOR HOME USE): POC Glucose: 170 mg/dl — AB (ref 70–99)

## 2021-08-20 LAB — POCT GLYCOSYLATED HEMOGLOBIN (HGB A1C): Hemoglobin A1C: 5.5 % (ref 4.0–5.6)

## 2021-08-20 NOTE — Patient Instructions (Signed)
It was a pleasure seeing you in clinic today. Please do not hesitate to contact me if you have questions or concerns.  ° °Please sign up for MyChart. This is a communication tool that allows you to send an email directly to me. This can be used for questions, prescriptions and blood sugar reports. We will also release labs to you with instructions on MyChart. Please do not use MyChart if you need immediate or emergency assistance. Ask our wonderful front office staff if you need assistance.  ° °

## 2021-08-20 NOTE — Progress Notes (Signed)
Pediatric Endocrinology Consultation Follow-up Visit ? ?Katelyn Lutz ?08-03-1995 ?[id number] ? ? ?Chief Complaint: Follow-up of Type 1 diabetes ? ?HPI: ?Katelyn ForsterMadison  is a 26 y.o. female presenting for follow-up of the above concerns.  she attended this visit alone. ? ?1. Katelyn Lutz was diagnosed with new onset type 1 diabetes on 10/12/12. She had completed a course of antibiotics for a facial laceration and had developed oral thrush. She was seen at Urgent Care where they questioned why a previously healthy young adult would have thrush. They obtained a finger stick BG which was too high to read on the POC BG meter. They then obtained serum labs and urine which were consistent with new onset diabetes. She was then sent to [facility] where she was diagnosed with T1DM and started on insulin. ? ?2. Katelyn Lutz was last seen at [facility] on 10/2020.  Since last visit, she has been well. ? 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 [place] in [place]  ? ?She reports that she is doing well with diabetes care. She is changing pump sites every 3 to 4 days, rarely has failed pump sites. Hypoglycemia has been rare, she starts to feel signs of lows when she is under 80.  ? 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 Need adjustment for settings for when she eats at night.  ? ? ? ?Insulin regimen:  ?Time Basal Rate Correction Factor Carb Ratio Target BG  ?12A 0.85 60 15 150  ?4A 0.95 60 15  150  ?6A 1.050 55 10 130  ?8A 1.1  55 10 130  ?12P 1.2  55  11 130  ?9P 1.35 60 11 130  ?10P 1.1 60 11 150  ?Total Daily Basal: 26.8 units/day ? ?CGM download: Pump is setting for low alarm under 100. Time under 80 is much less frequent. Having blood sugar spike between 8am-2pm.  ? ?Hypoglycemia: can feel low blood sugars.  No glucagon needed recently. Needs new rx for glucagon ?Wearing Med-alert ID currently: Yes  ?Injection sites: abdominal wall, buttock(s)  and thigh(s) ?Annual labs due: 10/2021  ?Ophthalmology due: 2022- no problems. Due  ? ?ROS:  ?All systems reviewed with pertinent positives listed below; otherwise negative. ?Constitutional: Sleeping well. Weight stable.  ?HEENT: No neck pain. No difficulty swallowing.  ?Respiratory: No increased work of breathing currently ?Cardiac: no tachycardia.  ?GI: No constipation or diarrhea ?Musculoskeletal: No joint deformity ?Neuro: Normal affect.No tremor.  ?Endocrine: As above ? ?Past Medical History:   ?Past Medical History:  ?Diagnosis Date  ? Diabetes mellitus without complication (HCC)   ? New onset type 1 diabetes mellitus, uncontrolled ([facility]) 10/25/2012  ? Pigmentation abnormality of skin   ? no pigment in eyelids or genital area  ? ? ?Meds: ?Outpatient Encounter Medications as of 08/20/2021  ?Medication Sig  ? Continuous Blood Gluc Sensor (DEXCOM G6 SENSOR) MISC CHANGE SENSOR EVERY 10 DAYS  ? Continuous Blood Gluc Transmit (DEXCOM G6 TRANSMITTER) MISC Change transmitter every 90 days  ? Insulin Infusion Pump (T:SLIM X2 INSULIN PMP BASAL IQ) DEVI   ? Insulin Infusion Pump Supplies (AUTOSOFT XC INFUSION SET) MISC CHANGE INFUSION SET EVERY  48-72 HOURS  ? Insulin Infusion Pump Supplies (T:SLIM X2 3ML CARTRIDGE) MISC CHANGE CARTRIDGE EVERY     48-72 HOURS.  ? Norethin Ace-Eth Estrad-FE 1-20 MG-MCG(24) CAPS Take 1 tablet by mouth daily.  ? NOVOLOG 100 UNIT/ML injection USE  300 UNITS IN INSULIN PUMP EVERY 48 HOURS  ? ACCU-CHEK FASTCLIX LANCETS MISC Check sugar 10 x daily. Lancets come in boxes of 102 ea.. Please dispense 9 boxes. (Patient not taking: Reported on 03/26/2020)  ? BAYER CONTOUR NEXT TEST test strip CHECK BLOOD SUGAR 10 X DAILY AS DIRECTED. PT IS ON INSULIN PUMP. (Patient not taking: No sig reported)  ? Clindamycin-Benzoyl Per, Refr, gel Apply 1 application topically daily. (Patient not taking: Reported on 08/20/2021)  ? Glucagon (BAQSIMI TWO PACK) 3 MG/DOSE POWD Place 1 application into the nose as needed. Use  as directed if unconscious, unable to take food po, or having a seizure due to hypoglycemia (Patient not taking: Reported on 03/26/2020)  ? glucagon (GLUCAGON EMERGENCY) 1 MG injection USE FOR SEVERE HYPOGLYCEMIA, INJECT 1MG  INTRAMUSCULARLY IF UNRESPONSIVE OR UNCONCIOUS (Patient not taking: Reported on 09/24/2019)  ? nitrofurantoin, macrocrystal-monohydrate, (MACROBID) 100 MG capsule Take 1 capsule (100 mg total) by mouth 2 (two) times daily. (Patient not taking: Reported on 09/24/2019)  ? tretinoin (RETIN-A) 0.025 % gel Apply 1 application topically daily. (Patient not taking: Reported on 03/26/2020)  ? Urine Glucose-Ketones Test STRP Use to check urine in cases of hyperglycemia. Dispense 2 vials of 50 each (Patient not taking: Reported on 03/26/2020)  ? ?No facility-administered encounter medications on file as of 08/20/2021.  ? ? ?Allergies: ?No Known Allergies ? ?Surgical History: ?No past surgical history on file.  ? ?Family History:  ?Family History  ?Problem Relation Age of Onset  ? Diabetes Other   ?     father's maternal grandmother had type 1 diabetes  ? Hypertension Maternal Grandfather   ? Thyroid disease Paternal Grandmother   ? Hypertension Maternal Aunt   ? Asthma Maternal Uncle   ? Asthma Paternal Aunt   ? ? ?Social History: ?Attends 08/22/20[employer] school of nursing.  ? ?Physical Exam:  ?Vitals:  ? 08/20/21 1406  ?BP: 118/72  ?Pulse: 88  ?Weight: 125 lb 6.4 oz (56.9 kg)  ? ? ? ?BP 118/72   Pulse 88   Wt 125 lb 6.4 oz (56.9 kg)   BMI 19.59 kg/m?  ?Body mass index: body mass index is 19.59 kg/m?08/22/21 ?Growth percentile SmartLinks can only be used for patients less than 64 years old. ? ?Wt Readings from Last 3 Encounters:  ?08/20/21 125 lb 6.4 oz (56.9 kg)  ?10/27/20 122 lb 9.6 oz (55.6 kg)  ?07/09/20 121 lb (54.9 kg)  ? ?Ht Readings from Last 3 Encounters:  ?04/24/19 5' 7.09" (1.704 m)  ?10/11/18 5\' 8"  (1.727 m)  ?10/10/18 5\' 8"  (1.727 m)  ? ?General: Well developed, well nourished female in no acute distress.    ?Head: Normocephalic, atraumatic.   ?Eyes:  Pupils equal and round. EOMI.   Sclera white.  No eye drainage.   ?Ears/Nose/Mouth/Throat: Nares patent, no nasal drainage.  Normal dentition, mucous membranes moist.   ?Neck: supple, no cervical lymphadenopathy, no thyromegaly ?Cardiovascular: regular rate, normal S1/S2, no murmurs ?Respiratory: No increased work of breathing.  Lungs clear to auscultation bilaterally.  No wheezes. ?Abdomen: soft, nontender, nondistended. No appreciable masses  ?Extremities: warm, well perfused, cap refill < 2 sec.   ?Musculoskeletal: Normal muscle mass.  Normal strength ?Skin: warm, dry.  No rash or lesions. ?Neurologic: alert and oriented, normal speech, no tremor ? ? ? ?Labs: ?Results for orders placed or performed in visit on 08/20/21  ?POCT Glucose (Device for Home Use)  ?Result Value Ref Range  ? Glucose Fasting, POC    ? POC  Glucose 170 (A) 70 - 99 mg/dl  ?POCT glycosylated hemoglobin (Hb A1C)  ?Result Value Ref Range  ? Hemoglobin A1C 5.5 4.0 - 5.6 %  ? HbA1c POC (<> result, manual entry)    ? HbA1c, POC (prediabetic range)    ? HbA1c, POC (controlled diabetic range)    ? ? ?Assessment/Plan: ?VONDRA ALDREDGE is a 26 y.o. female with well controlled T1DM on a pump and CGM regimen (tanmdem control IQ). She is doing well with diabetes care. Her TIR meets goal of >70% with minimal hypoglycemia. Hemoglobin A1c is 5.5% today.  ? ? ?1. Type 1 diabetes mellitus without complications (HCC)/hypoglycemia/hyperglycemia  ?- Reviewed insulin pump and CGM download. Discussed trends and patterns.  ?- Rotate pump sites to prevent scar tissue.  ?- bolus 15 minutes prior to eating to limit blood sugar spikes.  ?- Reviewed carb counting and importance of accurate carb counting.  ?- Discussed signs and symptoms of hypoglycemia. Always have glucose available.  ?- POCT glucose and hemoglobin A1c  ?- Reviewed growth chart.  ?-Discussed blood sugar changes working night shifts. Will adjust carb ratios  for when she is eating at night.  ?- Discussed new technology including freestyle libre 3 and Dexcom G7  ?- Will begin transition to adult endocrinology, plan for adult endocrine in 6 months.  ? ?2. Insulin

## 2021-09-19 ENCOUNTER — Other Ambulatory Visit (INDEPENDENT_AMBULATORY_CARE_PROVIDER_SITE_OTHER): Payer: Self-pay | Admitting: Family

## 2021-11-03 ENCOUNTER — Other Ambulatory Visit (INDEPENDENT_AMBULATORY_CARE_PROVIDER_SITE_OTHER): Payer: Self-pay | Admitting: Family

## 2021-11-03 DIAGNOSIS — E109 Type 1 diabetes mellitus without complications: Secondary | ICD-10-CM

## 2021-11-19 ENCOUNTER — Ambulatory Visit (INDEPENDENT_AMBULATORY_CARE_PROVIDER_SITE_OTHER): Payer: BC Managed Care – PPO | Admitting: Family

## 2021-11-20 ENCOUNTER — Encounter (INDEPENDENT_AMBULATORY_CARE_PROVIDER_SITE_OTHER): Payer: Self-pay | Admitting: Family

## 2021-11-20 ENCOUNTER — Ambulatory Visit (INDEPENDENT_AMBULATORY_CARE_PROVIDER_SITE_OTHER): Payer: BC Managed Care – PPO | Admitting: Family

## 2021-11-20 VITALS — BP 110/72 | HR 69 | Wt 123.0 lb

## 2021-11-20 DIAGNOSIS — E1065 Type 1 diabetes mellitus with hyperglycemia: Secondary | ICD-10-CM | POA: Diagnosis not present

## 2021-11-20 DIAGNOSIS — Z4681 Encounter for fitting and adjustment of insulin pump: Secondary | ICD-10-CM

## 2021-11-20 LAB — POCT GLYCOSYLATED HEMOGLOBIN (HGB A1C): Hemoglobin A1C: 6 % — AB (ref 4.0–5.6)

## 2021-11-20 LAB — POCT GLUCOSE (DEVICE FOR HOME USE): POC Glucose: 130 mg/dl — AB (ref 70–99)

## 2021-11-20 MED ORDER — INSULIN ASPART 100 UNIT/ML IJ SOLN
INTRAMUSCULAR | 3 refills | Status: AC
Start: 2021-11-20 — End: ?

## 2021-11-20 NOTE — Patient Instructions (Signed)
It was a pleasure seeing you in clinic today. Please do not hesitate to contact me if you have questions or concerns.  ° °Please sign up for MyChart. This is a communication tool that allows you to send an email directly to me. This can be used for questions, prescriptions and blood sugar reports. We will also release labs to you with instructions on MyChart. Please do not use MyChart if you need immediate or emergency assistance. Ask our wonderful front office staff if you need assistance.  ° °

## 2021-11-20 NOTE — Progress Notes (Signed)
Pediatric Endocrinology Consultation Follow-up Visit  Katelyn Lutz 07-31-1995 [id number]   Chief Complaint: Follow-up of Type 1 diabetes  HPI: Katelyn Lutz  is a 26 y.o. female presenting for follow-up of the above concerns.  she attended this visit alone.  1. Katelyn Lutz was diagnosed with new onset type 1 diabetes on 10/12/12. She had completed a course of antibiotics for a facial laceration and had developed oral thrush. She was seen at Urgent Care where they questioned why a previously healthy young adult would have thrush. They obtained a finger stick BG which was too high to read on the POC BG meter. They then obtained serum labs and urine which were consistent with new onset diabetes. She was then sent to [facility] where she was diagnosed with T1DM and started on insulin.  2. Katelyn Lutz was last seen at [facility] on 08/2021.  Since last visit, she has been well.  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   Using Tandem TSlim insulin pump and Dexcom CGM which are working well. She occasionally forgetsto bolus when she is busy at lunch time. Also works rotating shifts so she has to increase her carbs when bolusing at night. Hypoglycemia has been rare, none severe.     Insulin regimen: Tandem TSlim  Time Basal Rate Correction Factor Carb Ratio Target BG  12A 0.85 60 12  150  4A 0.95 60 12  150  6A 1.050 55 10 130  8A 1.1  55 10 130  12P 1.2  55  11 130  9P 1.35 60 11 130  10P 1.1 60 11 150  Total Daily Basal: 26.8 units/day  CGM download: Pump is setting for low alarm under 100. Time under 80 is much less frequent. Having blood sugar spike between 8am-2pm.   Hypoglycemia: can feel low blood sugars.  No glucagon needed recently. Needs new rx for glucagon Wearing Med-alert ID currently: Yes  Injection sites: abdominal wall, buttock(s) and thigh(s) Annual labs due: 10/2021  Ophthalmology due: 2022- no problems. Due   ROS:  All systems reviewed with pertinent  positives listed below; otherwise negative. Constitutional: Sleeping well. Weight stable.  HEENT: No neck pain. No difficulty swallowing.  Respiratory: No increased work of breathing currently Cardiac: no tachycardia.  GI: No constipation or diarrhea Musculoskeletal: No joint deformity Neuro: Normal affect.No tremor.  Endocrine: As above  Past Medical History:   Past Medical History:  Diagnosis Date   Diabetes mellitus without complication (HCC)    New onset type 1 diabetes mellitus, uncontrolled 10/25/2012   Pigmentation abnormality of skin    no pigment in eyelids or genital area    Meds: Outpatient Encounter Medications as of 11/20/2021  Medication Sig   ACCU-CHEK FASTCLIX LANCETS MISC Check sugar 10 x daily. Lancets come in boxes of 102 ea.. Please dispense 9 boxes. (Patient not taking: Reported on 03/26/2020)   BAYER CONTOUR NEXT TEST test strip CHECK BLOOD SUGAR 10 X DAILY AS DIRECTED. PT IS ON INSULIN PUMP. (Patient not taking: No sig reported)   Clindamycin-Benzoyl Per, Refr, gel Apply 1 application topically daily. (Patient not taking: Reported on 08/20/2021)   Continuous Blood Gluc Sensor (DEXCOM G6 SENSOR) MISC CHANGE SENSOR EVERY 10 DAYS   Continuous Blood Gluc Transmit (DEXCOM G6 TRANSMITTER) MISC CHANGE TRANSMITTER EVERY 90DAYS   Glucagon (BAQSIMI TWO PACK) 3 MG/DOSE POWD Place 1 application into the nose as needed. Use as directed if unconscious, unable to take food po, or having a seizure due  to hypoglycemia (Patient not taking: Reported on 03/26/2020)   glucagon (GLUCAGON EMERGENCY) 1 MG injection USE FOR SEVERE HYPOGLYCEMIA, INJECT 1MG  INTRAMUSCULARLY IF UNRESPONSIVE OR UNCONCIOUS (Patient not taking: Reported on 09/24/2019)   Insulin Infusion Pump (T:SLIM X2 INSULIN PMP BASAL IQ) DEVI    Insulin Infusion Pump Supplies (AUTOSOFT XC INFUSION SET) MISC CHANGE INFUSION SET EVERY  48-72 HOURS   Insulin Infusion Pump Supplies (T:SLIM X2 3ML CARTRIDGE) MISC CHANGE CARTRIDGE  EVERY     48-72 HOURS.   nitrofurantoin, macrocrystal-monohydrate, (MACROBID) 100 MG capsule Take 1 capsule (100 mg total) by mouth 2 (two) times daily. (Patient not taking: Reported on 09/24/2019)   Norethin Ace-Eth Estrad-FE 1-20 MG-MCG(24) CAPS Take 1 tablet by mouth daily.   NOVOLOG 100 UNIT/ML injection USE 300 UNITS IN INSULIN PUMP EVERY 48 HOURS *PT NEEDS APPT FOR MORE REFILLS   tretinoin (RETIN-A) 0.025 % gel Apply 1 application topically daily. (Patient not taking: Reported on 03/26/2020)   Urine Glucose-Ketones Test STRP Use to check urine in cases of hyperglycemia. Dispense 2 vials of 50 each (Patient not taking: Reported on 03/26/2020)   No facility-administered encounter medications on file as of 11/20/2021.    Allergies: No Known Allergies  Surgical History: No past surgical history on file.   Family History:  Family History  Problem Relation Age of Onset   Diabetes Other        father's maternal grandmother had type 1 diabetes   Hypertension Maternal Grandfather    Thyroid disease Paternal Grandmother    Hypertension Maternal Aunt    Asthma Maternal Uncle    Asthma Paternal Aunt     Social History: Attends [employer].   Physical Exam:  There were no vitals filed for this visit.    There were no vitals taken for this visit. Body mass index: body mass index is unknown because there is no height or weight on file. Growth %ile SmartLinks can only be used for patients less than 46 years old.  Wt Readings from Last 3 Encounters:  08/20/21 125 lb 6.4 oz (56.9 kg)  10/27/20 122 lb 9.6 oz (55.6 kg)  07/09/20 121 lb (54.9 kg)   Ht Readings from Last 3 Encounters:  04/24/19 5' 7.09" (1.704 m)  10/11/18 5\' 8"  (1.727 m)  10/10/18 5\' 8"  (1.727 m)   General: Well developed, well nourished female in no acute distress.   Head: Normocephalic, atraumatic.   Eyes:  Pupils equal and round. EOMI.   Sclera white.  No eye drainage.   Ears/Nose/Mouth/Throat: Nares  patent, no nasal drainage.  Normal dentition, mucous membranes moist.   Neck: supple, no cervical lymphadenopathy, no thyromegaly Cardiovascular: regular rate, normal S1/S2, no murmurs Respiratory: No increased work of breathing.  Lungs clear to auscultation bilaterally.  No wheezes. Abdomen: soft, nontender, nondistended. No appreciable masses  Extremities: warm, well perfused, cap refill < 2 sec.   Musculoskeletal: Normal muscle mass.  Normal strength Skin: warm, dry.  No rash or lesions. Neurologic: alert and oriented, normal speech, no tremor    Labs: Results for orders placed or performed in visit on 08/20/21  POCT Glucose (Device for Home Use)  Result Value Ref Range   Glucose Fasting, POC     POC Glucose 170 (A) 70 - 99 mg/dl  POCT glycosylated hemoglobin (Hb A1C)  Result Value Ref Range   Hemoglobin A1C 5.5 4.0 - 5.6 %   HbA1c POC (<> result, manual entry)     HbA1c, POC (prediabetic range)  HbA1c, POC (controlled diabetic range)      Assessment/Plan: Katelyn Lutz is a 26 y.o. female with well controlled T1DM on a pump and CGM regimen (tanmdem control IQ). She is having a pattern of hypoglycemia between 2pm-5pm while working, needs reduction in basal rate. Hemoglobin A1c is 6% today which meets ADA goal of <7%. TIR is 69%.    1. Type 1 diabetes mellitus without complications (HCC)/hypoglycemia/hyperglycemia  - Reviewed insulin pump and CGM download. Discussed trends and patterns.  - Rotate pump sites to prevent scar tissue.  - bolus 15 minutes prior to eating to limit blood sugar spikes.  - Reviewed carb counting and importance of accurate carb counting.  - Discussed signs and symptoms of hypoglycemia. Always have glucose available.  - POCT glucose and hemoglobin A1c  - Reviewed growth chart.  - Discussed diabetes technology.   2. Insulin pump titration Time Basal Rate Correction Factor Carb Ratio Target BG  12A 0.85 60 12 --> 11 150  4A 0.95 60 12 --> 11  150  6A 1.050 55 10 130  8A 1.1  55 10 130  12P 1.2--> 1.12   55  11 130  9P 1.35 60 11 130  10P 1.1 60 11 150  Total Daily Basal: 25.4 units/day Follow-up: 3 months.   Medical decision-making:  >45 spent today reviewing the medical chart, counseling the patient/family, and documenting today's visit.      Gretchen Short,  FNP-C  Pediatric Specialist  [address]  [place], [address]  Tele: [phone]

## 2022-01-06 ENCOUNTER — Encounter: Payer: Self-pay | Admitting: Internal Medicine

## 2022-02-03 ENCOUNTER — Other Ambulatory Visit (INDEPENDENT_AMBULATORY_CARE_PROVIDER_SITE_OTHER): Payer: Self-pay | Admitting: Family

## 2022-02-03 DIAGNOSIS — E1065 Type 1 diabetes mellitus with hyperglycemia: Secondary | ICD-10-CM

## 2022-02-16 ENCOUNTER — Ambulatory Visit (INDEPENDENT_AMBULATORY_CARE_PROVIDER_SITE_OTHER): Payer: BC Managed Care – PPO | Admitting: Family

## 2022-02-16 ENCOUNTER — Encounter (INDEPENDENT_AMBULATORY_CARE_PROVIDER_SITE_OTHER): Payer: Self-pay | Admitting: Family

## 2022-02-16 VITALS — BP 118/78 | HR 81 | Wt 128.4 lb

## 2022-02-16 DIAGNOSIS — Z4681 Encounter for fitting and adjustment of insulin pump: Secondary | ICD-10-CM | POA: Diagnosis not present

## 2022-02-16 DIAGNOSIS — E109 Type 1 diabetes mellitus without complications: Secondary | ICD-10-CM | POA: Diagnosis not present

## 2022-02-16 DIAGNOSIS — E1065 Type 1 diabetes mellitus with hyperglycemia: Secondary | ICD-10-CM

## 2022-02-16 DIAGNOSIS — L80 Vitiligo: Secondary | ICD-10-CM | POA: Insufficient documentation

## 2022-02-16 LAB — POCT GLUCOSE (DEVICE FOR HOME USE): POC Glucose: 252 mg/dl — AB (ref 70–99)

## 2022-02-16 LAB — POCT GLYCOSYLATED HEMOGLOBIN (HGB A1C): Hemoglobin A1C: 5.6 % (ref 4.0–5.6)

## 2022-02-16 NOTE — Patient Instructions (Signed)
Time Basal Rate Correction Factor Carb Ratio Target BG  12A 0.85 60 11 150  4A 0.95 60 11 150  6A 1.050--> 0.95  55 10--> 12  130  8A 1.1--> 1.0   55 10--> 12  130  12P 1.12   55  11 130  9P 1.35 60 11 130  10P 1.1 60 11 150  Total Daily Basal: 24.8  units/day  It was a pleasure seeing you in clinic today. Please do not hesitate to contact me if you have questions or concerns.   Please sign up for MyChart. This is a communication tool that allows you to send an email directly to me. This can be used for questions, prescriptions and blood sugar reports. We will also release labs to you with instructions on MyChart. Please do not use MyChart if you need immediate or emergency assistance. Ask our wonderful front office staff if you need assistance.

## 2022-02-16 NOTE — Progress Notes (Signed)
Pediatric Endocrinology Consultation Follow-up Visit  Katelyn Lutz April 11, 1996 [id number]   Chief Complaint: Follow-up of Type 1 diabetes  HPI: Katelyn Lutz  is a 26 y.o. female presenting for follow-up of the above concerns.  she attended this visit alone.  [place] was diagnosed with new onset type 1 diabetes on 10/12/12. She had completed a course of antibiotics for a facial laceration and had developed oral thrush. She was seen at Urgent Care where they questioned why a previously healthy young adult would have thrush. They obtained a finger stick BG which was too high to read on the POC BG meter. They then obtained serum labs and urine which were consistent with new onset diabetes. She was then sent to [facility] where she was diagnosed with T1DM and started on insulin.  [place] was last seen at [facility] on 08/2021.  Since last visit, she has been well.  She is working rotating shifts at [employer] like she is doing ok with rotating shifts but occasionally gets nauseous. She uses Tandem TSlim insulin pump and Dexcom CGM. Consistently boluses before eating. No severe hypoglycemia or need for glucagon.    Concerns:  - Blood sugars go low around 8 am when she gets off night shifts. When working day shifts she will go low between 9-10 am.     Insulin regimen: Tandem TSlim  Time Basal Rate Correction Factor Carb Ratio Target BG  12A 0.85 60 12  150  4A 0.95 60 12  150  6A 1.050 55 10 130  8A 1.1  55 10 130  12P 1.2  55  11 130  9P 1.35 60 11 130  10P 1.1 60 11 150  Total Daily Basal: 26.8 units/day  CGM download: Pump is setting for low alarm under 100. Time under 80 is much less frequent. Having blood sugar spike between 8am-2pm.   Hypoglycemia: can feel low blood sugars.  No glucagon needed recently. Needs new rx for glucagon Wearing Med-alert ID currently: Yes  Injection sites: abdominal wall, buttock(s) and thigh(s) Annual labs due: 10/2021 --> ordered  Ophthalmology  due: 2024. No retinopathy at last visit.   ROS:  All systems reviewed with pertinent positives listed below; otherwise negative. Constitutional: Sleeping well. Weight stable.  HEENT: No neck pain. No difficulty swallowing.  Respiratory: No increased work of breathing currently Cardiac: no tachycardia.  GI: No constipation or diarrhea Musculoskeletal: No joint deformity Neuro: Normal affect.No tremor.  Endocrine: As above  Past Medical History:   Past Medical History:  Diagnosis Date   Diabetes mellitus without complication ([place])    New onset type 1 diabetes mellitus, uncontrolled 10/25/2012   Pigmentation abnormality of skin    no pigment in eyelids or genital area    Meds: Outpatient Encounter Medications as of 02/16/2022  Medication Sig   Continuous Blood Gluc Sensor (DEXCOM G6 SENSOR) MISC CHANGE SENSOR EVERY 10 DAYS   Continuous Blood Gluc Transmit (DEXCOM G6 TRANSMITTER) MISC CHANGE TRANSMITTER EVERY 90DAYS   insulin aspart (NOVOLOG) 100 UNIT/ML injection USE 300 UNITS IN INSULIN PUMP EVERY 48 HOURS   Insulin Infusion Pump (T:SLIM X2 INSULIN PMP BASAL IQ) DEVI    Insulin Infusion Pump Supplies (AUTOSOFT XC INFUSION SET) MISC CHANGE INFUSION SET EVERY  48-72 HOURS   Insulin Infusion Pump Supplies (T:SLIM X2 3ML CARTRIDGE) MISC CHANGE CARTRIDGE EVERY     48-72 HOURS.   Norethin Ace-Eth Estrad-FE 1-20 MG-MCG(24) CAPS Take 1 tablet by mouth daily.   ACCU-CHEK FASTCLIX LANCETS MISC  Check sugar 10 x daily. Lancets come in boxes of 102 ea.. Please dispense 9 boxes. (Patient not taking: Reported on 03/26/2020)   BAYER CONTOUR NEXT TEST test strip CHECK BLOOD SUGAR 10 X DAILY AS DIRECTED. PT IS ON INSULIN PUMP. (Patient not taking: No sig reported)   Clindamycin-Benzoyl Per, Refr, gel Apply 1 application topically daily. (Patient not taking: Reported on 08/20/2021)   Glucagon (BAQSIMI TWO PACK) 3 MG/DOSE POWD Place 1 application into the nose as needed. Use as directed if unconscious,  unable to take food po, or having a seizure due to hypoglycemia (Patient not taking: Reported on 03/26/2020)   glucagon (GLUCAGON EMERGENCY) 1 MG injection USE FOR SEVERE HYPOGLYCEMIA, INJECT 1MG  INTRAMUSCULARLY IF UNRESPONSIVE OR UNCONCIOUS (Patient not taking: Reported on 09/24/2019)   tretinoin (RETIN-A) 0.025 % gel Apply 1 application topically daily. (Patient not taking: Reported on 03/26/2020)   Urine Glucose-Ketones Test STRP Use to check urine in cases of hyperglycemia. Dispense 2 vials of 50 each (Patient not taking: Reported on 03/26/2020)   No facility-administered encounter medications on file as of 02/16/2022.    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   Physical Exam:  Vitals:   02/16/22 1132  BP: 118/78  Pulse: 81  Weight: 128 lb 6.4 oz (58.2 kg)      BP 118/78   Pulse 81   Wt 128 lb 6.4 oz (58.2 kg)   BMI 20.06 kg/m  Body mass index: body mass index is 20.06 kg/m. Growth %ile SmartLinks can only be used for patients less than 52 years old.  Wt Readings from Last 3 Encounters:  02/16/22 128 lb 6.4 oz (58.2 kg)  11/20/21 123 lb (55.8 kg)  08/20/21 125 lb 6.4 oz (56.9 kg)   Ht Readings from Last 3 Encounters:  04/24/19 5' 7.09" (1.704 m)  10/11/18 5\' 8"  (1.727 m)  10/10/18 5\' 8"  (1.727 m)   General: Well developed, well nourished female in no acute distress.   Head: Normocephalic, atraumatic.   Eyes:  Pupils equal and round. EOMI.   Sclera white.  No eye drainage.   Ears/Nose/Mouth/Throat: Nares patent, no nasal  drainage.  Normal dentition, mucous membranes moist.   Neck: supple, no cervical lymphadenopathy, no thyromegaly Cardiovascular: regular rate, normal S1/S2, no murmurs Respiratory: No increased work of breathing.  Lungs clear to auscultation bilaterally.  No wheezes. Abdomen: soft, nontender, nondistended. No appreciable masses  Extremities: warm, well perfused, cap refill < 2 sec.   Musculoskeletal: Normal muscle mass.  Normal strength Skin: warm, dry.  No rash or lesions. Neurologic: alert and oriented, normal speech, no tremor    Labs: Results for orders placed or performed in visit on 02/16/22  POCT glycosylated hemoglobin (Hb A1C)  Result Value Ref Range   Hemoglobin A1C 5.6 4.0 - 5.6 %   HbA1c POC (<> result, manual entry)     HbA1c, POC (prediabetic range)     HbA1c, POC (controlled diabetic range)    POCT Glucose (Device  for Home Use)  Result Value Ref Range   Glucose Fasting, POC     POC Glucose 252 (A) 70 - 99 mg/dl    Assessment/Plan: VESPER TRANT is a 26 y.o. female with well controlled T1DM on a pump and CGM regimen (tanmdem control IQ). Jayleana is working rotating 12 hour shifts which is leading to episodes of hypoglycemia between 7am-10. Hemoglobin A1c is 5.6% which meets ADA goal of <7% however, I would prefer to reduce episodes of hypoglycemia even if hemoglobin A1c increases slightly. \Her time in target range is 66%  with 8% hypoglycemia    1. Type 1 diabetes mellitus without complications (HCC)/hypoglycemia/hyperglycemia  - Reviewed insulin pump and CGM download. Discussed trends and patterns.  - Rotate pump sites to prevent scar tissue.  - bolus 15 minutes prior to eating to limit blood sugar spikes.  - Reviewed carb counting and importance of accurate carb counting.  - Discussed signs and symptoms of hypoglycemia. Always have glucose available.  - POCT glucose and hemoglobin A1c  - Reviewed growth chart.  - Discussed new diabetes tech including  tandem Mobi and Dexcom G7  - She will transition to adult endocrinology at [facility]. Encouraged to contact me with any concerns until that time.  - Labs; Lipid panel, TFTs, and microalbumin ordered.   2. Insulin pump titration Time Basal Rate Correction Factor Carb Ratio Target BG  12A 0.85 60 11 150  4A 0.95 60 11 150  6A 1.050--> 0.95  55 10--> 12  130  8A 1.1--> 1.0   55 10--> 12  130  12P 1.12   55  11 130  9P 1.35 60 11 130  10P 1.1 60 11 150  Total Daily Basal: 24.8  units/day Follow-up: 3 months.   Medical decision-making:  LOS: >45 spent today reviewing the medical chart, counseling the patient/family, and documenting today's visit.       Gretchen Short,  FNP-C  Pediatric Specialist  [address]  [place], [address]  Tele: [phone]

## 2022-02-22 ENCOUNTER — Encounter: Payer: Self-pay | Admitting: Internal Medicine

## 2022-04-10 ENCOUNTER — Encounter (INDEPENDENT_AMBULATORY_CARE_PROVIDER_SITE_OTHER): Payer: Self-pay

## 2022-04-12 ENCOUNTER — Other Ambulatory Visit (INDEPENDENT_AMBULATORY_CARE_PROVIDER_SITE_OTHER): Payer: Self-pay

## 2022-04-12 MED ORDER — DEXCOM G7 SENSOR MISC: 6 refills | Status: DC

## 2022-04-29 ENCOUNTER — Telehealth (INDEPENDENT_AMBULATORY_CARE_PROVIDER_SITE_OTHER): Payer: Self-pay | Admitting: Family

## 2022-04-29 NOTE — Telephone Encounter (Signed)
  Name of who is calling:  Caller's Relationship to Patient:  Best contact number: [phone] ext [phone] Fax# [phone]  Provider they see: Dalbert Garnet  Reason for call: request new prescription for the (T:SLIM X2 insulin pump  Dec 27th script was sent via fax and e-script, want to confirm it was received     PRESCRIPTION REFILL ONLY  Name of prescription: (T:SLIM X2 CARTRIDGE)   Pharmacy:

## 2022-04-29 NOTE — Telephone Encounter (Signed)
A user error has taken place: encounter opened in error, closed for administrative reasons.

## 2022-04-30 NOTE — Telephone Encounter (Signed)
Faxed

## 2022-05-10 ENCOUNTER — Other Ambulatory Visit (INDEPENDENT_AMBULATORY_CARE_PROVIDER_SITE_OTHER): Payer: Self-pay

## 2022-05-10 DIAGNOSIS — E1065 Type 1 diabetes mellitus with hyperglycemia: Secondary | ICD-10-CM

## 2022-05-10 MED ORDER — DEXCOM G7 SENSOR MISC: 5 refills | Status: AC

## 2022-05-11 ENCOUNTER — Encounter: Payer: Self-pay | Admitting: Internal Medicine

## 2022-05-14 ENCOUNTER — Encounter (INDEPENDENT_AMBULATORY_CARE_PROVIDER_SITE_OTHER): Payer: Self-pay

## 2022-06-16 ENCOUNTER — Other Ambulatory Visit (INDEPENDENT_AMBULATORY_CARE_PROVIDER_SITE_OTHER): Payer: Self-pay | Admitting: Family

## 2022-06-17 ENCOUNTER — Other Ambulatory Visit (INDEPENDENT_AMBULATORY_CARE_PROVIDER_SITE_OTHER): Payer: Self-pay | Admitting: Family

## 2022-06-17 MED ORDER — T:SLIM X2 3ML CARTRIDGE MISC: 1 refills | Status: AC

## 2022-07-03 ENCOUNTER — Encounter (INDEPENDENT_AMBULATORY_CARE_PROVIDER_SITE_OTHER): Payer: Self-pay

## 2023-03-01 ENCOUNTER — Encounter (INDEPENDENT_AMBULATORY_CARE_PROVIDER_SITE_OTHER): Payer: Self-pay

## 2023-05-18 ENCOUNTER — Encounter (INDEPENDENT_AMBULATORY_CARE_PROVIDER_SITE_OTHER): Payer: Self-pay

## 2023-06-28 ENCOUNTER — Encounter: Payer: Self-pay | Admitting: Internal Medicine

## 2023-08-09 ENCOUNTER — Encounter (INDEPENDENT_AMBULATORY_CARE_PROVIDER_SITE_OTHER): Payer: Self-pay

## 2023-08-22 ENCOUNTER — Encounter (INDEPENDENT_AMBULATORY_CARE_PROVIDER_SITE_OTHER): Payer: Self-pay
